# Patient Record
Sex: Male | Born: 1945 | Race: White | Hispanic: No | Marital: Married | Smoking: Former smoker
Health system: Southern US, Community
[De-identification: ages and names within clinical notes are randomized; demographics above are authoritative.]

## PROBLEM LIST (undated history)

## (undated) DIAGNOSIS — I10 Essential (primary) hypertension: Secondary | ICD-10-CM

## (undated) DIAGNOSIS — J309 Allergic rhinitis, unspecified: Secondary | ICD-10-CM

## (undated) DIAGNOSIS — E785 Hyperlipidemia, unspecified: Secondary | ICD-10-CM

## (undated) DIAGNOSIS — J45909 Unspecified asthma, uncomplicated: Secondary | ICD-10-CM

## (undated) HISTORY — PX: TONSILLECTOMY: SUR1361

## (undated) HISTORY — DX: Unspecified asthma, uncomplicated: J45.909

## (undated) HISTORY — PX: HEMORROIDECTOMY: SUR656

## (undated) HISTORY — DX: Hyperlipidemia, unspecified: E78.5

## (undated) HISTORY — PX: COLONOSCOPY: SHX174

## (undated) HISTORY — DX: Allergic rhinitis, unspecified: J30.9

## (undated) HISTORY — PX: APPENDECTOMY: SHX54

## (undated) HISTORY — PX: OTHER SURGICAL HISTORY: SHX169

---

## 2010-05-06 DIAGNOSIS — E785 Hyperlipidemia, unspecified: Secondary | ICD-10-CM | POA: Insufficient documentation

## 2010-05-06 DIAGNOSIS — J309 Allergic rhinitis, unspecified: Secondary | ICD-10-CM | POA: Insufficient documentation

## 2010-05-07 ENCOUNTER — Ambulatory Visit
Admission: RE | Admit: 2010-05-07 | Discharge: 2010-05-07 | Payer: Self-pay | Source: Home / Self Care | Attending: Pulmonary Disease | Admitting: Pulmonary Disease

## 2010-05-07 DIAGNOSIS — J45909 Unspecified asthma, uncomplicated: Secondary | ICD-10-CM | POA: Insufficient documentation

## 2010-05-07 DIAGNOSIS — R05 Cough: Secondary | ICD-10-CM | POA: Insufficient documentation

## 2010-05-27 NOTE — Assessment & Plan Note (Signed)
Summary: consult for chronic cough   Copy to:  Berenda Morale Primary Provider/Referring Provider:  Berenda Morale  CC:  Pulmonary Consult.  History of Present Illness: the pt is a 65y/o male who I have been asked to see for chronic cough.  The pt states that he has been coughing for a year, and started on its on own without URI symptoms.  It is dry and hacking in nature, but the pt denies a tickle in his throat.  He does describe a classic globus sensation.  He tells me that he has had constant throat clearing "all of my life".  He does have postnasal drip, but feels it is a little better with nasal spray and anithistamine.  He denies any reflux symptoms, and tells me his cough was no better after four weeks of prilosec.  He denies any h/o childhood asthma, and states that his exertional tolerance is normal.  He had a cxr 2 weeks ago that was normal.    Current Medications (verified): 1)  Lipitor 10 Mg Tabs (Atorvastatin Calcium) .... Once Daily 2)  Fluticasone Propionate 50 Mcg/act Susp (Fluticasone Propionate) .... 2 Sprays At Bedtime 3)  Adult Aspirin Ec Low Strength 81 Mg Tbec (Aspirin) .... Once Daily 4)  Allegra Allergy 180 Mg Tabs (Fexofenadine Hcl) .... Once Daily  Allergies (verified): No Known Drug Allergies  Past History:  Past Medical History:  HYPERLIPIDEMIA (ICD-272.4) ALLERGIC RHINITIS (ICD-477.9)    Past Surgical History: hemorrhoidectomy 2004 appendectomy 1990s grown removed from behind L ear 2010  Family History: Reviewed history from 05/06/2010 and no changes required. dementia: father, mother DM: father cancer: daughter (breast)   Social History: Reviewed history from 05/06/2010 and no changes required. Patient former smoker.  started 1964.  1 pack per week.  1982. retired from [employer]. married children 2  Review of Systems       The patient complains of nasal congestion/difficulty breathing through nose.  The patient denies shortness of  breath with activity, shortness of breath at rest, productive cough, non-productive cough, coughing up blood, chest pain, irregular heartbeats, acid heartburn, indigestion, loss of appetite, weight change, abdominal pain, difficulty swallowing, sore throat, tooth/dental problems, headaches, sneezing, itching, ear ache, anxiety, depression, hand/feet swelling, joint stiffness or pain, rash, change in color of mucus, and fever.    Vital Signs:  Patient profile:   65 year old male Height:      72 inches Weight:      207 pounds BMI:     28.18 O2 Sat:      95 % on Room air Temp:     98.0 degrees F oral Pulse rate:   60 / minute BP sitting:   136 / 88  (left arm) Cuff size:   regular  Vitals Entered By: Arman Filter LPN (May 07, 2010 9:45 AM)  O2 Flow:  Room air CC: Pulmonary Consult Comments Medications reviewed with patient Arman Filter LPN  May 07, 2010 9:51 AM    Physical Exam  General:  wd male in nad Eyes:  PERRLA and EOMI.   Nose:  mild septal deviation to left with mild obstruction mild erythema or mucosa Mouth:  clear, no exudates mucus seen posteriorly Neck:  no jvd, tmg, LN Lungs:  totally clear to auscultation Heart:  rrr, no mrg  Abdomen:  soft and nontender, bs+ Extremities:  no edema or cyanosis  pulses intact distally Neurologic:  alert and oriented, moves all 4.   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  He has a very remote h/o smoking in the past.  I would like to start him on ICS that will not further irritate his upper airway, and will minimize cough.    Problem # 2:  COUGH (ICD-786.2) there is no question he has airflow obstruction that can contribute to his cough, but there is other historical info that suggests he has an element of cyclical coughing as well.  He also has known AR with postnasal drip  that can drive his cough.  Will work on behavioral therapies for cyclical coughing, and will also try a stronger antihistamine for his pnd.  Medications Added to Medication List This Visit: 1)  Fluticasone Propionate 50 Mcg/act Susp (Fluticasone propionate) .... 2 sprays at bedtime  Other Orders: Consultation Level V (16109) Spirometry w/Graph (60454)  Patient Instructions: 1)  will start on qvar  2 inhalations am and pm.  Rinse mouth well. 2)  stay on flonase nasal spray 2 each nostril every am 3)  stop allegra.  In its place take chlorpheniramine 8mg  one at bedtime 4)  use hard candy during day to avoid throat clearing 5)  do not overuse your voice (yelling, singing, etc). 6)  followup with me in 3 weeks.

## 2010-05-28 ENCOUNTER — Ambulatory Visit (INDEPENDENT_AMBULATORY_CARE_PROVIDER_SITE_OTHER): Payer: Medicare Other | Admitting: Pulmonary Disease

## 2010-05-28 ENCOUNTER — Encounter: Payer: Self-pay | Admitting: Pulmonary Disease

## 2010-05-28 DIAGNOSIS — R05 Cough: Secondary | ICD-10-CM

## 2010-05-28 DIAGNOSIS — J45991 Cough variant asthma: Secondary | ICD-10-CM

## 2010-05-28 DIAGNOSIS — R059 Cough, unspecified: Secondary | ICD-10-CM

## 2010-06-17 NOTE — Assessment & Plan Note (Signed)
Summary: rov for chronic cough   Copy to:  Berenda Morale Primary Provider/Referring Provider:  Berenda Morale  CC:  Pt here for follow-up for chronic cough.  Pt states cough is 75% improved. Marland Kitchen  History of Present Illness: the pt comes in today for f/u of his multifactorial cough.  He was felt to have an upper airway cough, but also found to have airflow obstruction by spirometry.  He has been started on qvar, but also treated for upper dysfunction syndrome.  He states that his cough is at least 75% improved.  Current Medications (verified): 1)  Lipitor 10 Mg Tabs (Atorvastatin Calcium) .... Once Daily 2)  Fluticasone Propionate 50 Mcg/act Susp (Fluticasone Propionate) .... 2 Sprays At Bedtime 3)  Adult Aspirin Ec Low Strength 81 Mg Tbec (Aspirin) .... Once Daily 4)  Chlorpheniramine Maleate 4 Mg Tabs (Chlorpheniramine Maleate) .... 2  Tablet At Betime 5)  Qvar 80 Mcg/act Aers (Beclomethasone Dipropionate) .... 2 Puffs Twice Daily  Allergies (verified): No Known Drug Allergies  Review of Systems  The patient denies shortness of breath with activity, shortness of breath at rest, productive cough, non-productive cough, coughing up blood, chest pain, irregular heartbeats, acid heartburn, indigestion, loss of appetite, weight change, abdominal pain, difficulty swallowing, sore throat, tooth/dental problems, headaches, nasal congestion/difficulty breathing through nose, sneezing, itching, ear ache, anxiety, depression, hand/feet swelling, joint stiffness or pain, rash, change in color of mucus, and fever.    Vital Signs:  Patient profile:   65 year old male Height:      72 inches Weight:      199.13 pounds O2 Sat:      95 % on Room air Temp:     98.5 degrees F oral Pulse rate:   64 / minute BP sitting:   122 / 78  (right arm) Cuff size:   regular  Vitals Entered By: Carron Curie CMA (May 28, 2010 10:33 AM)  O2 Flow:  Room air CC: Pt here for follow-up for chronic cough.  Pt  states cough is 75% improved.  Comments Medications reviewed with patient Carron Curie CMA  May 28, 2010 10:37 AM Daytime phone number verified with patient.    Physical Exam  General:  wd male in nad Lungs:  totally clear to auscultation Heart:  rrr, no mrg Extremities:  no edema or cyanosis  Neurologic:  alert, oriented, moves all 4.    Impression & Recommendations:  Problem # 1:  COUGH (ICD-786.2) the pt is 75% improved with treatment of upper airway irritation and cyclical cough.  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  Will continue on qvar for another 3 mos, then try to d/c.  If cough comes back, will need to stay on ICS for lifetime.  Medications Added to Medication List This Visit: 1)  Chlorpheniramine Maleate 4 Mg Tabs (Chlorpheniramine maleate) .... 2  tablet at betime 2)  Qvar 80 Mcg/act Aers (Beclomethasone dipropionate) .... 2 puffs twice daily  Other Orders: Est. Patient Level III (43329)  Patient Instructions: 1)  can change nasal spray and chlorpheniramine to as needed. 2)  stay on qvar until next visit 3)  continue behavioral therapies as we have discussed if you think the cough begins to  re-escalate. 4)  followup with me in 3mos.   Prescriptions: QVAR 80 MCG/ACT AERS (BECLOMETHASONE DIPROPIONATE) 2 puffs twice daily  #1 x 6   Entered and Authorized by:   Barbaraann Share MD   Signed by:   Barbaraann Share MD on 05/28/2010   Method used:   Print then Give to Patient   RxID:   [id number]    Immunization History:  Influenza Immunization History:    Influenza:  historical (02/09/2010)  Pneumovax Immunization History:    Pneumovax:  historical (01/12/2009)

## 2010-08-27 ENCOUNTER — Ambulatory Visit: Payer: Medicare Other | Admitting: Pulmonary Disease

## 2010-08-31 ENCOUNTER — Encounter: Payer: Self-pay | Admitting: Pulmonary Disease

## 2010-09-08 ENCOUNTER — Encounter: Payer: Self-pay | Admitting: Pulmonary Disease

## 2010-09-08 ENCOUNTER — Ambulatory Visit (INDEPENDENT_AMBULATORY_CARE_PROVIDER_SITE_OTHER): Payer: Medicare Other | Admitting: Pulmonary Disease

## 2010-09-08 DIAGNOSIS — J45909 Unspecified asthma, uncomplicated: Secondary | ICD-10-CM

## 2010-09-08 DIAGNOSIS — R059 Cough, unspecified: Secondary | ICD-10-CM

## 2010-09-08 DIAGNOSIS — R05 Cough: Secondary | ICD-10-CM

## 2010-09-08 NOTE — Progress Notes (Signed)
  Subjective:    Patient ID: Lucas Murphy, male    DOB: 03-14-1946, 65 y.o.   MRN: [id number]  HPI The pt comes in today for f/u of his cough and possible asthma.  He was started on qvar for cough and airflow obstruction noted on spirometry, but it was unclear if this was a postviral bronchiolitis vs true cough variant asthma.  He comes in today where he has been compliant with qvar, and feels his cough and breathing is well controlled.  He does have postnasal drip at times with mucus in his throat.    Review of Systems  Constitutional: Negative for fever and unexpected weight change.  HENT: Positive for rhinorrhea and postnasal drip. Negative for ear pain, nosebleeds, congestion, sore throat, sneezing, trouble swallowing, dental problem and sinus pressure.   Eyes: Negative for redness and itching.  Respiratory: Positive for cough. Negative for chest tightness, shortness of breath and wheezing.   Cardiovascular: Negative for palpitations and leg swelling.  Gastrointestinal: Negative for nausea and vomiting.  Genitourinary: Negative for dysuria.  Musculoskeletal: Negative for joint swelling.  Skin: Positive for rash.  Neurological: Negative for headaches.  Hematological: Does not bruise/bleed easily.  Psychiatric/Behavioral: Negative for dysphoric mood. The patient is not nervous/anxious.        Objective:   Physical Exam Wd male in nad Chest totally clear Cor with rrr LE without edema, no cyanosis noted. Alert, oriented, moves all 4        Assessment & Plan:

## 2010-09-08 NOTE — Patient Instructions (Signed)
Trial off qvar to see if your cough worsens or if your breathing changes.  If you need to get back on qvar, please make an apptm with me in 6mos.  If you see no change off qvar, followup with me as needed. Can use nasal spray and antihistamine as needed for postnasal drip

## 2010-09-11 NOTE — Assessment & Plan Note (Signed)
It is unclear whether his past airflow obstruction on spirometry was related to a postviral bronchiolitis at the time, or whether is part of asthma.  He has done well over the months since the prior visit, and it may be worthwhile to try him off qvar to see if he has a return of his symptoms.  If he does, then clearly this is asthma and he will require maintenance ICS.  If his symptoms do not return, then his episode earlier in the year was probably a post viral issue.

## 2010-09-11 NOTE — Assessment & Plan Note (Addendum)
The pt's cough has nearly resolved on treatment.  I continue to believe his cough is MORE upper airway in origin than lower and related to a post-viral cough, however cannot determine at this point if it may be related to asthma as well.   He is continuing with behavioral therapies to minimize throat clearing and the cyclical component of the cough, and is using nasal ICS and an antihistamine for his postnasal drip.

## 2011-03-11 ENCOUNTER — Ambulatory Visit: Payer: Medicare Other | Admitting: Pulmonary Disease

## 2011-03-14 ENCOUNTER — Ambulatory Visit (INDEPENDENT_AMBULATORY_CARE_PROVIDER_SITE_OTHER): Payer: Medicare Other | Admitting: Adult Health

## 2011-03-14 ENCOUNTER — Encounter: Payer: Self-pay | Admitting: Adult Health

## 2011-03-14 VITALS — BP 128/82 | HR 50 | Temp 97.9°F | Ht 72.0 in | Wt 189.0 lb

## 2011-03-14 DIAGNOSIS — J45909 Unspecified asthma, uncomplicated: Secondary | ICD-10-CM

## 2011-03-14 NOTE — Patient Instructions (Signed)
Begin Dulera 2 puffs Twice daily   Stop QVAR  May take Chlor tabs 4 mg 1 every 4 hr As needed  Drainge/tickle in throat  Continue on Chlor tabs 4mg  2 At bedtime   Nexium 40mg   daily before meals for 2 weeks  Try to avoid clearing throat/coughing is possilbe, use sugarless candy, water, etc NO mints.  follow up Dr. Shelle Iron in 3-4 weeks and As needed   Please contact office for sooner follow up if symptoms do not improve or worsen or seek emergency care

## 2011-03-17 ENCOUNTER — Encounter: Payer: Self-pay | Admitting: Adult Health

## 2011-03-17 NOTE — Assessment & Plan Note (Signed)
Mild flare with associated chronic cough , ? coomplicated by Rhinitis and gerd   Plan:  Begin Dulera 2 puffs Twice daily   Stop QVAR  May take Chlor tabs 4 mg 1 every 4 hr As needed  Drainge/tickle in throat  Continue on Chlor tabs 4mg  2 At bedtime   Nexium 40mg   daily before meals for 2 weeks  Try to avoid clearing throat/coughing is possilbe, use sugarless candy, water, etc NO mints.  follow up Dr. Shelle Iron in 3-4 weeks and As needed   Please contact office for sooner follow up if symptoms do not improve or worsen or seek emergency

## 2011-03-17 NOTE — Progress Notes (Signed)
  Subjective:    Patient ID: Lucas Murphy, male    DOB: [birth year], 65 y.o.   MRN: [id number]  HPI 65 yo male followed for cough and possible asthma   03/14/11 Acute OV  Complains of runny nose, post nasal drip, dry cough and dyspnea.  Going on for while now -~ 2 months. More wheezing over last week.  Has a lot of drainage.  OTC not helping.  Worse at night    Review of Systems Constitutional:   No  weight loss, night sweats,  Fevers, chills,  +fatigue, or  lassitude.  HEENT:   No headaches,  Difficulty swallowing,  Tooth/dental problems, or  Sore throat,                No sneezing, itching, ear ache, nasal congestion,  ++post nasal drip,   CV:  No chest pain,  Orthopnea, PND, swelling in lower extremities, anasarca, dizziness, palpitations, syncope.   GI  No heartburn, indigestion, abdominal pain, nausea, vomiting, diarrhea, change in bowel habits, loss of appetite, bloody stools.   Resp:  ,  No coughing up of blood.     No chest wall deformity  Skin: no rash or lesions.  GU: no dysuria, change in color of urine, no urgency or frequency.  No flank pain, no hematuria   MS:  No joint pain or swelling.  No decreased range of motion.  No back pain.  Psych:  No change in mood or affect. No depression or anxiety.  No memory loss.          Objective:   Physical Exam GEN: A/Ox3; pleasant , NAD,   HEENT:  [place]/AT,  EACs-clear, TMs-wnl, NOSE-clear, THROAT-clear, no lesions, no postnasal drip or exudate noted.   NECK:  Supple w/ fair ROM; no JVD; normal carotid impulses w/o bruits; no thyromegaly or nodules palpated; no lymphadenopathy.  RESP  Coarse BS  w/o, wheezes/ rales/ or rhonchi.no accessory muscle use, no dullness to percussion  CARD:  RRR, no m/r/g  , no peripheral edema, pulses intact, no cyanosis or clubbing.  GI:   Soft & nt; nml bowel sounds; no organomegaly or masses detected.  Musco: Warm bil, no deformities or joint swelling noted.   Neuro: alert, no focal  deficits noted.    Skin: Warm, no lesions or rashes         Assessment & Plan:

## 2011-04-13 ENCOUNTER — Encounter: Payer: Self-pay | Admitting: Pulmonary Disease

## 2011-04-13 ENCOUNTER — Ambulatory Visit (INDEPENDENT_AMBULATORY_CARE_PROVIDER_SITE_OTHER): Payer: Medicare Other | Admitting: Pulmonary Disease

## 2011-04-13 ENCOUNTER — Telehealth: Payer: Self-pay | Admitting: Pulmonary Disease

## 2011-04-13 DIAGNOSIS — J309 Allergic rhinitis, unspecified: Secondary | ICD-10-CM | POA: Diagnosis not present

## 2011-04-13 DIAGNOSIS — J45909 Unspecified asthma, uncomplicated: Secondary | ICD-10-CM

## 2011-04-13 MED ORDER — MOMETASONE FURO-FORMOTEROL FUM 100-5 MCG/ACT IN AERO: 2.0000 | INHALATION_SPRAY | Freq: Two times a day (BID) | RESPIRATORY_TRACT | Status: DC

## 2011-04-13 NOTE — Progress Notes (Signed)
  Subjective:    Patient ID: Lucas Murphy, male    DOB: 1945-12-02, 66 y.o.   MRN: [id number]  HPI The patient comes in today for followup of his probable asthma.  It was initially unclear if he had a post viral bronchiolitis or whether he truly had underlying asthma.  He did have documented airflow obstruction after a viral URI.  The patient was also felt to have some upper airway dysfunction based on his symptomatology, probably related to postnasal drip.  He was recently seen by a nurse practitioner for increasing dyspnea on exertion, as well as some upper airway symptoms.  The patient had stopped Qvar, but noticed fairly quickly that his exertional tolerance and symptoms worsened, and therefore he restarted.  After being back on Qvar, he did not feel that he was responding as well as he had in the past.  Therefore, our nurse practitioner started him on dulera, and he feels that he is doing much better with this medication.  She also asked him to please and antihistamine more consistently, and gave him 2 weeks of Nexium for possible LPR.  The patient comes in today where he is doing very well since being on the dulera, but continues to have some postnasal drip symptoms.   Review of Systems  Constitutional: Negative for fever and unexpected weight change.  HENT: Positive for rhinorrhea. Negative for ear pain, nosebleeds, congestion, sore throat, sneezing, trouble swallowing, dental problem, postnasal drip and sinus pressure.   Eyes: Negative for redness and itching.  Respiratory: Negative for cough, chest tightness, shortness of breath and wheezing.   Cardiovascular: Negative for palpitations and leg swelling.  Gastrointestinal: Negative for nausea and vomiting.  Genitourinary: Negative for dysuria.  Musculoskeletal: Negative for joint swelling.  Skin: Negative for rash.  Neurological: Negative for headaches.  Hematological: Does not bruise/bleed easily.  Psychiatric/Behavioral: Negative for  dysphoric mood. The patient is not nervous/anxious.        Objective:   Physical Exam Well-developed male in no acute distress Nose without purulence or discharge noted Chest is totally clear to auscultation, no wheezes Heart exam with regular rate and rhythm Lower extremities without edema or cyanosis Alert and oriented, moves all 4 extremities.       Assessment & Plan:

## 2011-04-13 NOTE — Patient Instructions (Addendum)
I think you do have asthma, and will need to stay on some type of maintenance inhaler.  Either qvar or dulera would be good choices, and you can experiment with which helps you the most.  Let us know. Can take chlorpheniramine or zyrtec over the counter for your allergy symptoms.  Your nasal spray will help as well. followup with me in one year if doing well, but call if having issues.

## 2011-04-13 NOTE — Telephone Encounter (Signed)
I spoke with pt and he states the dulera was to expensive and wants an alternative to this. Pt also states that he would like a written rx for QVAR and would like to pick this up when ready. Pt states he though he had an rx already for QVAR but did not. Please advise Dr. Shelle Iron, thanks

## 2011-04-13 NOTE — Assessment & Plan Note (Signed)
The patient's history is most consistent with asthma, and therefore I do think he needs to be on some type of inhaler that has inhaled corticosteroid for maintenance.  The patient has done very well with dulera, but is wanting to try Qvar again to see if he can get by with less medication.  He will then decide which medication he wishes to stay on long-term for maintenance.

## 2011-04-13 NOTE — Telephone Encounter (Signed)
Ok to send rx for qvar

## 2011-04-13 NOTE — Assessment & Plan Note (Signed)
The pt has AR that is contributing to his intermittant ua instability.  I doubt reflux is an issue for him.  I asked him to continue on his nasal steroid, as well as prn antihistamine.

## 2011-04-14 MED ORDER — BECLOMETHASONE DIPROPIONATE 80 MCG/ACT IN AERS: 2.0000 | INHALATION_SPRAY | Freq: Two times a day (BID) | RESPIRATORY_TRACT | Status: DC

## 2011-04-14 NOTE — Telephone Encounter (Signed)
RX Qvar sent to [facility] on [address] LMOMTCB x 1 so pt can be notified.

## 2011-04-14 NOTE — Telephone Encounter (Signed)
Called and spoke with pt's Diplomatic Services operational officer.  Informed her KC out of office today but will be back tomorrow to sign rx.  Printed rx and put in [facility] VIP folder for him to sign.  ( I also called [facility] [address] To cancel the original rx that Lawson Fiscal had sent electronically)

## 2011-04-14 NOTE — Telephone Encounter (Signed)
Pt DOES NOT want this Qvar Rx called into [facility] [address].  Pt would like the written Rx.  Please call pt at [phone].  Antionette Fairy

## 2011-04-15 NOTE — Telephone Encounter (Signed)
Done

## 2011-04-15 NOTE — Telephone Encounter (Signed)
Called and spoke with pt's secretary who is aware rx signed and ready at front desk for pt to pick up M-F 8 to 5:30 pm.

## 2011-06-29 DIAGNOSIS — M79609 Pain in unspecified limb: Secondary | ICD-10-CM | POA: Diagnosis not present

## 2011-06-29 DIAGNOSIS — Z79899 Other long term (current) drug therapy: Secondary | ICD-10-CM | POA: Diagnosis not present

## 2011-06-29 DIAGNOSIS — R5381 Other malaise: Secondary | ICD-10-CM | POA: Diagnosis not present

## 2011-11-18 ENCOUNTER — Other Ambulatory Visit: Payer: Self-pay | Admitting: Pulmonary Disease

## 2011-12-28 DIAGNOSIS — E7800 Pure hypercholesterolemia, unspecified: Secondary | ICD-10-CM | POA: Diagnosis not present

## 2011-12-28 DIAGNOSIS — J45909 Unspecified asthma, uncomplicated: Secondary | ICD-10-CM | POA: Diagnosis not present

## 2011-12-28 DIAGNOSIS — Z23 Encounter for immunization: Secondary | ICD-10-CM | POA: Diagnosis not present

## 2011-12-28 DIAGNOSIS — Z79899 Other long term (current) drug therapy: Secondary | ICD-10-CM | POA: Diagnosis not present

## 2011-12-28 DIAGNOSIS — R21 Rash and other nonspecific skin eruption: Secondary | ICD-10-CM | POA: Diagnosis not present

## 2011-12-28 DIAGNOSIS — J309 Allergic rhinitis, unspecified: Secondary | ICD-10-CM | POA: Diagnosis not present

## 2012-03-22 DIAGNOSIS — L03019 Cellulitis of unspecified finger: Secondary | ICD-10-CM | POA: Diagnosis not present

## 2012-04-18 ENCOUNTER — Telehealth: Payer: Self-pay | Admitting: Pulmonary Disease

## 2012-04-18 ENCOUNTER — Ambulatory Visit (INDEPENDENT_AMBULATORY_CARE_PROVIDER_SITE_OTHER): Payer: Medicare Other | Admitting: Pulmonary Disease

## 2012-04-18 ENCOUNTER — Encounter: Payer: Self-pay | Admitting: Pulmonary Disease

## 2012-04-18 VITALS — BP 162/92 | HR 54 | Temp 97.6°F | Ht 72.0 in | Wt 182.2 lb

## 2012-04-18 DIAGNOSIS — J45909 Unspecified asthma, uncomplicated: Secondary | ICD-10-CM

## 2012-04-18 DIAGNOSIS — J309 Allergic rhinitis, unspecified: Secondary | ICD-10-CM | POA: Diagnosis not present

## 2012-04-18 NOTE — Telephone Encounter (Signed)
Noted.  Would let primary decide how he wants to handle this.

## 2012-04-18 NOTE — Progress Notes (Signed)
  Subjective:    Patient ID: Lucas Murphy, male    DOB: [birth year], 67 y.o.   MRN: [id number]  HPI Patient comes in today for followup of his asthma.  He has been doing very well on Qvar alone, and rarely uses his albuterol inhaler.  He is looking at various medications that may cost less.  I have written these down for him to check with his insurance company.  He is exercising regularly, and has had no issues with this.  He does still have postnasal drip whenever he plays golf, but otherwise is not a problem for him.   Review of Systems  Constitutional: Negative for fever and unexpected weight change.  HENT: Positive for rhinorrhea. Negative for ear pain, nosebleeds, congestion, sore throat, sneezing, trouble swallowing, dental problem, postnasal drip and sinus pressure.   Eyes: Negative for redness and itching.  Respiratory: Negative for cough, chest tightness, shortness of breath and wheezing.   Cardiovascular: Negative for palpitations and leg swelling.  Gastrointestinal: Negative for nausea and vomiting.  Genitourinary: Negative for dysuria.  Musculoskeletal: Negative for joint swelling.  Skin: Negative for rash.  Neurological: Negative for headaches.  Hematological: Does not bruise/bleed easily.  Psychiatric/Behavioral: Negative for dysphoric mood. The patient is not nervous/anxious.        Objective:   Physical Exam Well-developed male in no acute distress Nose without purulence or discharge noted Chest with totally clear breath sounds, no wheezing Cardiac exam is regular rate and rhythm Lower extremities without edema, no cyanosis Alert and oriented, moves all 4 extremities.        Assessment & Plan:

## 2012-04-18 NOTE — Patient Instructions (Addendum)
Stay on current medications, but will write down alternatives that you can check on for cost comparison. Can try taking zyrtec 10mg  otc the morning of your golf outing to see if helps your nasal symptoms.  Do not take everyday if you are taking chlorpheniramine at bedtime. followup with me in one year if doing well.

## 2012-04-18 NOTE — Assessment & Plan Note (Signed)
The patient is doing well at this point with inhaled corticosteroids alone, and I have asked him to continue on this.  I have also written down various options for him regarding treatment, and he will check with his insurance to see which is the most affordable.  If he is doing well, he will followup in one year, but he is to call if he notices increased albuterol use.

## 2012-04-18 NOTE — Telephone Encounter (Signed)
Pt states that he called his PCP and they gave him his last 2 BP readings: 12/2011 - 120/80 03/22/2012 - 130/80  He asked that I pass this on to [facility].

## 2012-07-04 ENCOUNTER — Telehealth: Payer: Self-pay | Admitting: Pulmonary Disease

## 2012-07-04 MED ORDER — BECLOMETHASONE DIPROPIONATE 80 MCG/ACT IN AERS: INHALATION_SPRAY | RESPIRATORY_TRACT | Status: DC

## 2012-07-04 NOTE — Telephone Encounter (Signed)
Spoke with patient-states he found out that Advair, Symbicort, and Qvar would cost him the same copay and just needed to have [facility] [address] Pt aware that I have sent refills. Nothing more needed at this time.

## 2012-07-09 DIAGNOSIS — Z0000 Encounter for general adult medical examination without abnormal findings: Secondary | ICD-10-CM | POA: Diagnosis not present

## 2012-07-09 DIAGNOSIS — Z136 Encounter for screening for cardiovascular disorders: Secondary | ICD-10-CM | POA: Diagnosis not present

## 2012-07-09 DIAGNOSIS — Z125 Encounter for screening for malignant neoplasm of prostate: Secondary | ICD-10-CM | POA: Diagnosis not present

## 2012-07-09 DIAGNOSIS — B353 Tinea pedis: Secondary | ICD-10-CM | POA: Diagnosis not present

## 2012-07-09 DIAGNOSIS — B351 Tinea unguium: Secondary | ICD-10-CM | POA: Diagnosis not present

## 2012-08-20 DIAGNOSIS — K5730 Diverticulosis of large intestine without perforation or abscess without bleeding: Secondary | ICD-10-CM | POA: Diagnosis not present

## 2012-08-20 DIAGNOSIS — Z1211 Encounter for screening for malignant neoplasm of colon: Secondary | ICD-10-CM | POA: Diagnosis not present

## 2012-08-20 DIAGNOSIS — D126 Benign neoplasm of colon, unspecified: Secondary | ICD-10-CM | POA: Diagnosis not present

## 2012-09-12 DIAGNOSIS — L820 Inflamed seborrheic keratosis: Secondary | ICD-10-CM | POA: Diagnosis not present

## 2012-09-12 DIAGNOSIS — D235 Other benign neoplasm of skin of trunk: Secondary | ICD-10-CM | POA: Diagnosis not present

## 2012-11-05 ENCOUNTER — Telehealth: Payer: Self-pay | Admitting: Pulmonary Disease

## 2012-11-05 NOTE — Telephone Encounter (Signed)
This is very difficult to dx/manage over the phone, suggest TParret OV this week

## 2012-11-05 NOTE — Telephone Encounter (Signed)
Spoke with pt in regards to coughing episodes. Pt states that he is having coughing "spells"/choking "spells" with liquids sporadically---not a constant thing, but he has never had this issue before. Pt asking if something that he is drinking could be causing this---states that he has really bad spells at times while drinking his coffee. Pt also notes increased cough/choking spells with warmer weather and increased humidity.  Pt would like to know if this is being caused by something that he is doing or what could possibly be causing this issue.  Last seen by [facility] 04/18/2012---told to follow up in 1 year.  Pt aware of KC being out of office.  Dr Delford Field, please advise any recs for pt. Thanks.

## 2012-11-05 NOTE — Telephone Encounter (Signed)
I spoke with pt. He is scheduled to see TP tomorrow afternoon at 4:30 (pt requests this time). Nothing further was needed

## 2012-11-06 ENCOUNTER — Encounter: Payer: Self-pay | Admitting: Adult Health

## 2012-11-06 ENCOUNTER — Ambulatory Visit (INDEPENDENT_AMBULATORY_CARE_PROVIDER_SITE_OTHER)
Admission: RE | Admit: 2012-11-06 | Discharge: 2012-11-06 | Disposition: A | Discharge status: Home / Self Care | Payer: Medicare Other | Source: Ambulatory Visit | Attending: Adult Health | Admitting: Adult Health

## 2012-11-06 ENCOUNTER — Ambulatory Visit (INDEPENDENT_AMBULATORY_CARE_PROVIDER_SITE_OTHER): Payer: Medicare Other | Admitting: Adult Health

## 2012-11-06 VITALS — BP 132/82 | HR 55 | Temp 98.7°F | Ht 71.5 in | Wt 174.2 lb

## 2012-11-06 DIAGNOSIS — J45909 Unspecified asthma, uncomplicated: Secondary | ICD-10-CM

## 2012-11-06 DIAGNOSIS — R9389 Abnormal findings on diagnostic imaging of other specified body structures: Secondary | ICD-10-CM | POA: Diagnosis not present

## 2012-11-06 DIAGNOSIS — J4520 Mild intermittent asthma, uncomplicated: Secondary | ICD-10-CM

## 2012-11-06 NOTE — Addendum Note (Signed)
Addended by: Boone Master E on: 11/06/2012 05:22 PM   Modules accepted: Orders

## 2012-11-06 NOTE — Assessment & Plan Note (Signed)
Cyclical cough /mild flare  Check xray   Plan  Continue on Clor tabs 4mg  1 in am and 2 At bedtime   Prilosec 20mg  daily before meals for 2 weeks  Try to avoid clearing throat/coughing is possilbe, use sugarless candy, water, etc NO mints.  May use Delsym 2 tsp Twice daily  .As needed  Cough  I will call with chest xray resutls.  follow up Dr. Shelle Iron as planned and As needed   Please contact office for sooner follow up if symptoms do not improve or worsen or seek emergency care

## 2012-11-06 NOTE — Progress Notes (Signed)
  Subjective:    Patient ID: Lucas Murphy, male    DOB: Dec 23, 1945, 67 y.o.   MRN: [id number]  HPI  67 yo male followed for cough and asthma   11/06/2012  Acute OV  Complains of dry cough, throat clearing off-and-on   Denies chest congestion, wheezing, tightness, fever.  Has throat clearing, drainage and dry cough that seems to wax and wane.  Does not feel short of breath. Is very active , does not limit his activity . Plays golf, skiing, etc.  Wife has noticed cough may be worse.      Review of Systems  Constitutional:   No  weight loss, night sweats,  Fevers, chills,  +fatigue, or  lassitude.  HEENT:   No headaches,  Difficulty swallowing,  Tooth/dental problems, or  Sore throat,                No sneezing, itching, ear ache, nasal congestion,  ++post nasal drip,   CV:  No chest pain,  Orthopnea, PND, swelling in lower extremities, anasarca, dizziness, palpitations, syncope.   GI  No heartburn, indigestion, abdominal pain, nausea, vomiting, diarrhea, change in bowel habits, loss of appetite, bloody stools.   Resp:  ,  No coughing up of blood.     No chest wall deformity  Skin: no rash or lesions.  GU: no dysuria, change in color of urine, no urgency or frequency.  No flank pain, no hematuria   MS:  No joint pain or swelling.  No decreased range of motion.  No back pain.  Psych:  No change in mood or affect. No depression or anxiety.  No memory loss.          Objective:   Physical Exam  GEN: A/Ox3; pleasant , NAD,   HEENT:  [place]/AT,  EACs-clear, TMs-wnl, NOSE-clear, THROAT-clear, no lesions, no postnasal drip or exudate noted.   NECK:  Supple w/ fair ROM; no JVD; normal carotid impulses w/o bruits; no thyromegaly or nodules palpated; no lymphadenopathy.  RESP  CTA bilaterally no  wheezes/ rales/ or rhonchi.no accessory muscle use, no dullness to percussion  CARD:  RRR, no m/r/g  , no peripheral edema, pulses intact, no cyanosis or clubbing.  GI:   Soft & nt; nml  bowel sounds; no organomegaly or masses detected.  Musco: Warm bil, no deformities or joint swelling noted.   Neuro: alert, no focal deficits noted.    Skin: Warm, no lesions or rashes         Assessment & Plan:

## 2012-11-06 NOTE — Patient Instructions (Addendum)
Continue on Clor tabs 4mg  1 in am and 2 At bedtime   Prilosec 20mg  daily before meals for 2 weeks  Try to avoid clearing throat/coughing is possilbe, use sugarless candy, water, etc NO mints.  May use Delsym 2 tsp Twice daily  .As needed  Cough  I will call with chest xray resutls.  follow up Dr. Shelle Iron as planned and As needed   Please contact office for sooner follow up if symptoms do not improve or worsen or seek emergency care

## 2012-11-08 NOTE — Progress Notes (Signed)
Quick Note:  LMOM TCB x1. ______ 

## 2012-11-08 NOTE — Progress Notes (Signed)
Quick Note:  Patient returned call. Advised of lab results / recs as stated by TP. Pt verbalized understanding and denied any questions. ______ 

## 2012-11-12 ENCOUNTER — Telehealth: Payer: Self-pay | Admitting: Pulmonary Disease

## 2012-11-12 NOTE — Telephone Encounter (Signed)
LMTCB x1 for pt.  

## 2012-11-12 NOTE — Telephone Encounter (Signed)
i spoke with pt. He stated he started the prilosec as advised by TP but forgot to ask what it was for. I advised him of her last note. Nothing further was needed

## 2012-12-26 ENCOUNTER — Telehealth: Payer: Self-pay | Admitting: Pulmonary Disease

## 2012-12-26 MED ORDER — BECLOMETHASONE DIPROPIONATE 80 MCG/ACT IN AERS: INHALATION_SPRAY | RESPIRATORY_TRACT | Status: DC

## 2012-12-26 NOTE — Telephone Encounter (Signed)
Called, spoke with pt.  Qvar rx was sent to [facility].  Pt aware.

## 2013-01-21 DIAGNOSIS — Z23 Encounter for immunization: Secondary | ICD-10-CM | POA: Diagnosis not present

## 2013-04-19 ENCOUNTER — Ambulatory Visit (INDEPENDENT_AMBULATORY_CARE_PROVIDER_SITE_OTHER): Payer: Medicare Other | Admitting: Pulmonary Disease

## 2013-04-19 ENCOUNTER — Encounter: Payer: Self-pay | Admitting: Pulmonary Disease

## 2013-04-19 ENCOUNTER — Encounter (INDEPENDENT_AMBULATORY_CARE_PROVIDER_SITE_OTHER): Payer: Self-pay

## 2013-04-19 VITALS — BP 122/90 | HR 63 | Temp 97.4°F | Ht 72.0 in | Wt 174.4 lb

## 2013-04-19 DIAGNOSIS — J45909 Unspecified asthma, uncomplicated: Secondary | ICD-10-CM

## 2013-04-19 DIAGNOSIS — J309 Allergic rhinitis, unspecified: Secondary | ICD-10-CM

## 2013-04-19 MED ORDER — ALBUTEROL SULFATE HFA 108 (90 BASE) MCG/ACT IN AERS: 2.0000 | INHALATION_SPRAY | Freq: Four times a day (QID) | RESPIRATORY_TRACT | Status: DC | PRN

## 2013-04-19 NOTE — Progress Notes (Signed)
   Subjective:    Patient ID: Lucas Murphy, male    DOB: [birth year], 68 y.o.   MRN: [id number]  HPI Patient comes in today for followup of his known asthma. He has done very well on Qvar, and has not had an acute exacerbation since last visit. He enjoys a very active lifestyle, and is not limited by his breathing.   Review of Systems  Constitutional: Negative for fever and unexpected weight change.  HENT: Negative for congestion, dental problem, ear pain, nosebleeds, postnasal drip, rhinorrhea, sinus pressure, sneezing, sore throat and trouble swallowing.   Eyes: Negative for redness and itching.  Respiratory: Negative for cough, chest tightness, shortness of breath and wheezing.   Cardiovascular: Negative for palpitations and leg swelling.  Gastrointestinal: Negative for nausea and vomiting.  Genitourinary: Negative for dysuria.  Musculoskeletal: Negative for joint swelling.  Skin: Negative for rash.  Neurological: Negative for headaches.  Hematological: Does not bruise/bleed easily.  Psychiatric/Behavioral: Negative for dysphoric mood. The patient is not nervous/anxious.        Objective:   Physical Exam Well-developed male in no acute distress Nose without purulence or discharge noted Neck without lymphadenopathy or thyromegaly Chest totally clear to auscultation Cardiac exam with regular rate and rhythm Lower extremities without edema, no cyanosis Alert and oriented, moves all 4 extremities       Assessment & Plan:

## 2013-04-19 NOTE — Patient Instructions (Signed)
Continue on qvar. Will send in prescription for albuterol to have on hand in the event of breathing issues.  Can take 2 puffs every 6 hrs if needed for "bad days". followup with me again in one year, but call if having breathing issues.

## 2013-04-19 NOTE — Assessment & Plan Note (Signed)
The patient is doing well from a pulmonary standpoint on Qvar. He's had no acute exacerbations or pulmonary infection since the last visit. He did have an episode of increased cough this summer that was probably secondary to postnasal drip and maybe reflux. This has subsequently resolved. I've asked him to continue on his maintenance inhaler, and to come back and see me in one year.

## 2013-07-15 DIAGNOSIS — Z125 Encounter for screening for malignant neoplasm of prostate: Secondary | ICD-10-CM | POA: Diagnosis not present

## 2013-07-15 DIAGNOSIS — R030 Elevated blood-pressure reading, without diagnosis of hypertension: Secondary | ICD-10-CM | POA: Diagnosis not present

## 2013-07-15 DIAGNOSIS — Z1331 Encounter for screening for depression: Secondary | ICD-10-CM | POA: Diagnosis not present

## 2013-07-15 DIAGNOSIS — Z23 Encounter for immunization: Secondary | ICD-10-CM | POA: Diagnosis not present

## 2013-07-15 DIAGNOSIS — R413 Other amnesia: Secondary | ICD-10-CM | POA: Diagnosis not present

## 2013-07-15 DIAGNOSIS — E7800 Pure hypercholesterolemia, unspecified: Secondary | ICD-10-CM | POA: Diagnosis not present

## 2013-07-15 DIAGNOSIS — Z79899 Other long term (current) drug therapy: Secondary | ICD-10-CM | POA: Diagnosis not present

## 2013-07-15 DIAGNOSIS — Z0000 Encounter for general adult medical examination without abnormal findings: Secondary | ICD-10-CM | POA: Diagnosis not present

## 2013-07-26 ENCOUNTER — Encounter (INDEPENDENT_AMBULATORY_CARE_PROVIDER_SITE_OTHER): Payer: Self-pay

## 2013-07-26 ENCOUNTER — Ambulatory Visit (INDEPENDENT_AMBULATORY_CARE_PROVIDER_SITE_OTHER): Payer: Medicare Other | Admitting: Diagnostic Neuroimaging

## 2013-07-26 ENCOUNTER — Encounter: Payer: Self-pay | Admitting: Diagnostic Neuroimaging

## 2013-07-26 VITALS — BP 159/94 | HR 59 | Ht 71.5 in | Wt 166.5 lb

## 2013-07-26 DIAGNOSIS — I10 Essential (primary) hypertension: Secondary | ICD-10-CM | POA: Diagnosis not present

## 2013-07-26 DIAGNOSIS — R413 Other amnesia: Secondary | ICD-10-CM

## 2013-07-26 NOTE — Patient Instructions (Signed)
I will check additional testing.  Focus on physical activity, diet (veggies, fruits, nuts, berries), mental and social stimulation.

## 2013-07-26 NOTE — Progress Notes (Signed)
[facility]  PATIENT: Lucas Murphy DOB: July 17, 1945  REFERRING CLINICIAN: Mady Haagensen HISTORY FROM: patient  REASON FOR VISIT: new consult   HISTORICAL  CHIEF COMPLAINT:  Chief Complaint  Patient presents with  . Memory Loss    HISTORY OF PRESENT ILLNESS:   68 year old right-handed male here for evaluation of memory loss and cognitive difficulty.  For past 2 years patient has had intermittent episodes of forgetting recent events, having to take more notes, word hesitancy and word finding difficulty. This is noticed by himself and his wife. Patient retired in 2011 and moved to [place]. Since that time he's been very active physically and socially. However he is concerned about his gradually progressive cognitive issues. Patient's mother and father both had dementia.  No change in smell or taste. No vivid dreams. No punching or kicking in sleep. No balance or walking difficulty. No syncope, chest pain or shortness of breath.  Otherwise patient is able to maintain all of his activities of daily living. 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  REVIEW OF SYSTEMS: Full 14 system review of systems performed and notable only for mild snoring mild memory loss.  ALLERGIES: No Known Allergies  HOME MEDICATIONS: Outpatient Prescriptions Prior to Visit  Medication Sig Dispense Refill  . aspirin 81 MG tablet Take 81 mg by mouth daily.        . beclomethasone (QVAR) 80 MCG/ACT inhaler INHALE 2 PUFFS INTO THE LUNG 2 TIMES DAILY  3 Inhaler  1  . chlorpheniramine (CHLOR-TRIMETON) 4 MG tablet 2 tablets at bedtime.  And also takes 1 tab daily .      . fluticasone (FLONASE) 50 MCG/ACT nasal spray Place 2 sprays into the nose daily.       Marland Kitchen albuterol (PROVENTIL HFA;VENTOLIN HFA) 108 (90 BASE) MCG/ACT inhaler Inhale 2 puffs into the lungs every 6 (six) hours as needed for wheezing or shortness of breath.  3 Inhaler   0   No facility-administered medications prior to visit.    PAST MEDICAL HISTORY: Past Medical History  Diagnosis Date  . Other and unspecified hyperlipidemia   . Allergic rhinitis, cause unspecified     PAST SURGICAL HISTORY: Past Surgical History  Procedure Laterality Date  . Appendectomy    . Hemorroidectomy      FAMILY HISTORY: Family History  Problem Relation Age of Onset  . Diabetes Father   . Dementia Father   . Dementia    . Dementia Mother     SOCIAL HISTORY:  History   Social History  . Marital Status: Married    Spouse Name: [name]    Number of Children: 2  . Years of Education: College   Occupational History  . Retired   .     Social History Main Topics  . Smoking status: Former Smoker -- 0.10 packs/day for 8 years    Types: Cigarettes    Quit date: 04/11/1980  . Smokeless tobacco: Never Used  . Alcohol Use: Yes     Comment: 2-3 weekly  . Drug Use: No  . Sexual Activity: Not on file   Other Topics Concern  . Not on file   Social History Narrative   Patient lives at home with wife.   Caffeine use: 2-3 cups daily     PHYSICAL EXAM  Filed Vitals:   07/26/13 0927  BP: 159/94  Pulse: 59  Height: 5' 11.5" (1.816 m)  Weight: 166 lb  8 oz (75.524 kg)    Not recorded    Body mass index is 22.9 kg/(m^2).  GENERAL EXAM: Patient is in no distress; well developed, nourished and groomed; neck is supple  CARDIOVASCULAR: Regular rate and rhythm, no murmurs, no carotid bruits  NEUROLOGIC: MENTAL STATUS: awake, alert, oriented to person, place and time, recent and remote memory intact, normal attention and concentration, language fluent, comprehension intact, naming intact, fund of knowledge appropriate; MMSE 28/30 (MISSES 1 ON RECALL AND PENTAGONS). AFT 11. GDS 1. NO FRONTAL RELEASE SIGNS. CRANIAL NERVE: no papilledema on fundoscopic exam, pupils equal and reactive to light, visual fields full to confrontation, extraocular muscles intact, no  nystagmus, facial sensation and strength symmetric, hearing intact, palate elevates symmetrically, uvula midline, shoulder shrug symmetric, tongue midline. MOTOR: normal bulk and tone, full strength in the BUE, BLE SENSORY: normal and symmetric to light touch, pinprick, temperature, vibration COORDINATION: finger-nose-finger, fine finger movements normal REFLEXES: deep tendon reflexes present and symmetric GAIT/STATION: narrow based gait; able to walk on toes, heels and tandem; romberg is negative    DIAGNOSTIC DATA (LABS, IMAGING, TESTING) - I reviewed patient records, labs, notes, testing and imaging myself where available.  No results found for this basename: WBC, HGB, HCT, MCV, PLT   No results found for this basename: na, k, cl, co2, glucose, bun, creatinine, calcium, prot, albumin, ast, alt, alkphos, bilitot, gfrnonaa, gfraa   No results found for this basename: CHOL, HDL, LDLCALC, LDLDIRECT, TRIG, CHOLHDL   No results found for this basename: HGBA1C   No results found for this basename: VITAMINB12   No results found for this basename: TSH      ASSESSMENT AND PLAN  68 y.o. year old male here with subjective memory loss word finding difficulties over past one to 2 years. Neurologic examination is unremarkable (MMSE 28/30).  Ddx: normal aging vs MCI vs early neurodegenerative dz  PLAN: - MRI, B12 level - Neurotrax computer cognitive assessment - reviewed importance of physical activity, diet (veggies, fruits, nuts, berries), mental and social stimulation   Orders Placed This Encounter  Procedures  . MR Brain Wo Contrast  . Vitamin B12   Return in about 3 months (around 10/25/2013).    Penni Bombard, MD 05/21/4707, 62:83 AM Certified in Neurology, Neurophysiology and Neuroimaging  [facility] [address] [place], [place] [address] [phone]

## 2013-07-27 LAB — VITAMIN B12: Vitamin B-12: 738 pg/mL (ref 211–946)

## 2013-08-02 ENCOUNTER — Ambulatory Visit (INDEPENDENT_AMBULATORY_CARE_PROVIDER_SITE_OTHER): Payer: Medicare Other | Admitting: Diagnostic Neuroimaging

## 2013-08-02 DIAGNOSIS — R413 Other amnesia: Secondary | ICD-10-CM | POA: Diagnosis not present

## 2013-08-08 DIAGNOSIS — I10 Essential (primary) hypertension: Secondary | ICD-10-CM | POA: Diagnosis not present

## 2013-08-21 ENCOUNTER — Ambulatory Visit
Admission: RE | Admit: 2013-08-21 | Discharge: 2013-08-21 | Disposition: A | Discharge status: Home / Self Care | Payer: Medicare Other | Source: Ambulatory Visit | Attending: Diagnostic Neuroimaging | Admitting: Diagnostic Neuroimaging

## 2013-08-21 DIAGNOSIS — R413 Other amnesia: Secondary | ICD-10-CM

## 2013-09-09 ENCOUNTER — Telehealth: Payer: Self-pay | Admitting: Diagnostic Neuroimaging

## 2013-09-09 NOTE — Telephone Encounter (Signed)
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   Mild scattered periventricular and subcortical foci of T2 hyperintensities; especially near the right peri-atrial region.    B12 was normal.   Keep appt in July 10th, I also advise him to bring his family with him.

## 2013-09-09 NOTE — Telephone Encounter (Signed)
Patient calling and requesting results for MRI and NeuroTrax Testing.  Please call and advise

## 2013-09-10 ENCOUNTER — Encounter (HOSPITAL_COMMUNITY): Payer: Self-pay | Admitting: Emergency Medicine

## 2013-09-10 ENCOUNTER — Emergency Department (HOSPITAL_COMMUNITY)
Admission: EM | Admit: 2013-09-10 | Discharge: 2013-09-10 | Disposition: A | Discharge status: Home / Self Care | Payer: Medicare Other | Attending: Emergency Medicine | Admitting: Emergency Medicine

## 2013-09-10 DIAGNOSIS — Z8709 Personal history of other diseases of the respiratory system: Secondary | ICD-10-CM | POA: Diagnosis not present

## 2013-09-10 DIAGNOSIS — Y929 Unspecified place or not applicable: Secondary | ICD-10-CM | POA: Insufficient documentation

## 2013-09-10 DIAGNOSIS — S61209A Unspecified open wound of unspecified finger without damage to nail, initial encounter: Secondary | ICD-10-CM | POA: Insufficient documentation

## 2013-09-10 DIAGNOSIS — S61219A Laceration without foreign body of unspecified finger without damage to nail, initial encounter: Secondary | ICD-10-CM

## 2013-09-10 DIAGNOSIS — IMO0002 Reserved for concepts with insufficient information to code with codable children: Secondary | ICD-10-CM | POA: Insufficient documentation

## 2013-09-10 DIAGNOSIS — Y9389 Activity, other specified: Secondary | ICD-10-CM | POA: Insufficient documentation

## 2013-09-10 DIAGNOSIS — Z79899 Other long term (current) drug therapy: Secondary | ICD-10-CM | POA: Insufficient documentation

## 2013-09-10 DIAGNOSIS — Z862 Personal history of diseases of the blood and blood-forming organs and certain disorders involving the immune mechanism: Secondary | ICD-10-CM | POA: Insufficient documentation

## 2013-09-10 DIAGNOSIS — Z8639 Personal history of other endocrine, nutritional and metabolic disease: Secondary | ICD-10-CM | POA: Diagnosis not present

## 2013-09-10 DIAGNOSIS — I10 Essential (primary) hypertension: Secondary | ICD-10-CM | POA: Diagnosis not present

## 2013-09-10 DIAGNOSIS — Z7982 Long term (current) use of aspirin: Secondary | ICD-10-CM | POA: Insufficient documentation

## 2013-09-10 DIAGNOSIS — W261XXA Contact with sword or dagger, initial encounter: Secondary | ICD-10-CM

## 2013-09-10 DIAGNOSIS — Z87891 Personal history of nicotine dependence: Secondary | ICD-10-CM | POA: Diagnosis not present

## 2013-09-10 DIAGNOSIS — W260XXA Contact with knife, initial encounter: Secondary | ICD-10-CM | POA: Insufficient documentation

## 2013-09-10 HISTORY — DX: Essential (primary) hypertension: I10

## 2013-09-10 NOTE — ED Provider Notes (Signed)
Medical screening examination/treatment/procedure(s) were performed by non-physician practitioner and as supervising physician I was immediately available for consultation/collaboration.   EKG Interpretation None        Malvin Johns, MD 09/10/13 2336

## 2013-09-10 NOTE — Discharge Instructions (Signed)
Keep your wound clean and dry. Please followup with your doctor tomorrow about your tetanus as discussed. Return for any changing or worsening symptoms.    Laceration Care, Adult A laceration is a cut or lesion that goes through all layers of the skin and into the tissue just beneath the skin. TREATMENT  Some lacerations may not require closure. Some lacerations may not be able to be closed due to an increased risk of infection. It is important to see your caregiver as soon as possible after an injury to minimize the risk of infection and maximize the opportunity for successful closure. If closure is appropriate, pain medicines may be given, if needed. The wound will be cleaned to help prevent infection. Your caregiver will use stitches (sutures), staples, wound glue (adhesive), or skin adhesive strips to repair the laceration. These tools bring the skin edges together to allow for faster healing and a better cosmetic outcome. However, all wounds will heal with a scar. Once the wound has healed, scarring can be minimized by covering the wound with sunscreen during the day for 1 full year. HOME CARE INSTRUCTIONS  For sutures or staples:  Keep the wound clean and dry.  If you were given a bandage (dressing), you should change it at least once a day. Also, change the dressing if it becomes wet or dirty, or as directed by your caregiver.  Wash the wound with soap and water 2 times a day. Rinse the wound off with water to remove all soap. Pat the wound dry with a clean towel.  After cleaning, apply a thin layer of the antibiotic ointment as recommended by your caregiver. This will help prevent infection and keep the dressing from sticking.  You may shower as usual after the first 24 hours. Do not soak the wound in water until the sutures are removed.  Only take over-the-counter or prescription medicines for pain, discomfort, or fever as directed by your caregiver.  Get your sutures or staples  removed as directed by your caregiver. For skin adhesive strips:  Keep the wound clean and dry.  Do not get the skin adhesive strips wet. You may bathe carefully, using caution to keep the wound dry.  If the wound gets wet, pat it dry with a clean towel.  Skin adhesive strips will fall off on their own. You may trim the strips as the wound heals. Do not remove skin adhesive strips that are still stuck to the wound. They will fall off in time. For wound adhesive:  You may briefly wet your wound in the shower or bath. Do not soak or scrub the wound. Do not swim. Avoid periods of heavy perspiration until the skin adhesive has fallen off on its own. After showering or bathing, gently pat the wound dry with a clean towel.  Do not apply liquid medicine, cream medicine, or ointment medicine to your wound while the skin adhesive is in place. This may loosen the film before your wound is healed.  If a dressing is placed over the wound, be careful not to apply tape directly over the skin adhesive. This may cause the adhesive to be pulled off before the wound is healed.  Avoid prolonged exposure to sunlight or tanning lamps while the skin adhesive is in place. Exposure to ultraviolet light in the first year will darken the scar.  The skin adhesive will usually remain in place for 5 to 10 days, then naturally fall off the skin. Do not pick at the  adhesive film. You may need a tetanus shot if:  You cannot remember when you had your last tetanus shot.  You have never had a tetanus shot. If you get a tetanus shot, your arm may swell, get red, and feel warm to the touch. This is common and not a problem. If you need a tetanus shot and you choose not to have one, there is a rare chance of getting tetanus. Sickness from tetanus can be serious. SEEK MEDICAL CARE IF:   You have redness, swelling, or increasing pain in the wound.  You see a red line that goes away from the wound.  You have  yellowish-white fluid (pus) coming from the wound.  You have a fever.  You notice a bad smell coming from the wound or dressing.  Your wound breaks open before or after sutures have been removed.  You notice something coming out of the wound such as wood or glass.  Your wound is on your hand or foot and you cannot move a finger or toe. SEEK IMMEDIATE MEDICAL CARE IF:   Your pain is not controlled with prescribed medicine.  You have severe swelling around the wound causing pain and numbness or a change in color in your arm, hand, leg, or foot.  Your wound splits open and starts bleeding.  You have worsening numbness, weakness, or loss of function of any joint around or beyond the wound.  You develop painful lumps near the wound or on the skin anywhere on your body. MAKE SURE YOU:   Understand these instructions.  Will watch your condition.  Will get help right away if you are not doing well or get worse. Document Released: 03/28/2005 Document Revised: 06/20/2011 Document Reviewed: 09/21/2010 [facility] Patient Information 2014 [place], [place].

## 2013-09-10 NOTE — ED Provider Notes (Signed)
CSN: [id number]     Arrival date & time 09/10/13  2119 History  This chart was scribed for non-physician practitioner, Hazel Sams, PA-C working with Malvin Johns, MD by Frederich Balding, ED scribe. This patient was seen in room WTR8/WTR8 and the patient's care was started at 10:47 PM.   Chief Complaint  Patient presents with  . Extremity Laceration   The history is provided by the patient. No language interpreter was used.   HPI Comments: Lucas Murphy is a 68 y.o. male who presents to the Emergency Department complaining of left index finger laceration that occurred prior to arrival. States he was trying to cut a lemon and accidentally cut his finger. Pt put alcohol on his finger after he cut it. Denies any weakness, reduced ROM or numbness. Currently takes aspirin daily. Pt is unsure of when his last tetanus was but thinks he is up to date.   Past Medical History  Diagnosis Date  . Other and unspecified hyperlipidemia   . Allergic rhinitis, cause unspecified   . Hypertension    Past Surgical History  Procedure Laterality Date  . Appendectomy    . Hemorroidectomy     Family History  Problem Relation Age of Onset  . Diabetes Father   . Dementia Father   . Dementia    . Dementia Mother    History  Substance Use Topics  . Smoking status: Former Smoker -- 0.10 packs/day for 8 years    Types: Cigarettes    Quit date: 04/11/1980  . Smokeless tobacco: Never Used  . Alcohol Use: Yes     Comment: 2-3 weekly    Review of Systems  Skin: Positive for wound.  All other systems reviewed and are negative.  Allergies  Review of patient's allergies indicates no known allergies.  Home Medications   Prior to Admission medications   Medication Sig Start Date End Date Taking? Authorizing Provider  aspirin 81 MG tablet Take 81 mg by mouth daily.     Yes Historical Provider, MD  beclomethasone (QVAR) 80 MCG/ACT inhaler INHALE 2 PUFFS INTO THE LUNG 2 TIMES DAILY 12/26/12  Yes Kathee Delton,  MD  chlorpheniramine (CHLOR-TRIMETON) 4 MG tablet 2 tablets at bedtime.  And also takes 1 tab daily .   Yes Historical Provider, MD  fluticasone (FLONASE) 50 MCG/ACT nasal spray Place 2 sprays into the nose daily.  02/11/11  Yes Historical Provider, MD   BP 174/87  Pulse 53  Temp(Src) 98.3 F (36.8 C) (Oral)  Resp 16  Ht 5' 11.5" (1.816 m)  Wt 172 lb (78.019 kg)  BMI 23.66 kg/m2  SpO2 99%  Physical Exam  Nursing note and vitals reviewed. Constitutional: He is oriented to person, place, and time. He appears well-developed and well-nourished. No distress.  HENT:  Head: Normocephalic and atraumatic.  Eyes: Conjunctivae and EOM are normal.  Neck: Normal range of motion. No tracheal deviation present.  Cardiovascular: Normal rate.   Pulmonary/Chest: Effort normal. No respiratory distress.  Musculoskeletal: Normal range of motion.  Neurological: He is alert and oriented to person, place, and time.  Skin: Skin is warm and dry.  2.5 cm linear laceration to left index finger.   Psychiatric: He has a normal mood and affect. His behavior is normal.    ED Course  Procedures   DIAGNOSTIC STUDIES: Oxygen Saturation is 99% on RA, normal by my interpretation.    COORDINATION OF CARE: 10:49 PM-Discussed treatment plan which includes laceration repair with pt at bedside and  pt agreed to plan. Pt states he will check with his PCP tomorrow to see when his last tetanus was.   LACERATION REPAIR Performed by: Martie Lee Authorized by: Martie Lee Consent: Verbal consent obtained. Risks and benefits: risks, benefits and alternatives were discussed Consent given by: patient Patient identity confirmed: provided demographic data Prepped and Draped in normal sterile fashion Wound explored  Laceration Location: left index finger  Laceration Length: 2.5cm  No Foreign Bodies seen or palpated  Anesthesia: none  Irrigation method: syringe Amount of cleaning: standard  Skin closure: 1  steri strip  Patient tolerance: Patient tolerated the procedure well with no immediate complications.    MDM   Final diagnoses:  Laceration of finger, left      I personally performed the services described in this documentation, which was scribed in my presence. The recorded information has been reviewed and is accurate.    Martie Lee, PA-C 09/10/13 2309

## 2013-09-10 NOTE — ED Notes (Signed)
Pt cut L index finger with knife while working in the kitchen. No active bleeding at this time. About 1 inch laceration noted.

## 2013-09-17 ENCOUNTER — Other Ambulatory Visit: Payer: Self-pay | Admitting: Pulmonary Disease

## 2013-09-20 ENCOUNTER — Telehealth: Payer: Self-pay | Admitting: Pulmonary Disease

## 2013-09-20 MED ORDER — BECLOMETHASONE DIPROPIONATE 80 MCG/ACT IN AERS: INHALATION_SPRAY | RESPIRATORY_TRACT | Status: DC

## 2013-09-20 NOTE — Telephone Encounter (Signed)
Called and spoke with pt and he stated that he ran out of his qvar and has ordered it but it will take 7-10 days to get to him.  Wanted to see if we had a sample that he could come and pick up today.  Pt is aware of sample that has been left up front and he will stop by today to pick this up.  Nothing further is needed.

## 2013-10-16 ENCOUNTER — Telehealth: Payer: Self-pay | Admitting: Diagnostic Neuroimaging

## 2013-10-16 NOTE — Telephone Encounter (Signed)
Called patient regarding rescheduling 10/18/13 appointment per Dr. Gladstone Lighter schedule, spoke to wife since number listed for patient has a voicemail that hasn't been set up yet. Wife states that she would like a sooner appointment because they haven't discussed anything with the doctor regarding his tests. Please return call to patient's wife and advise.

## 2013-10-17 NOTE — Telephone Encounter (Signed)
Called and left VM message to return call

## 2013-10-17 NOTE — Telephone Encounter (Signed)
Spoke with wife and informed that we will place patient on wait list for possibly sooner appt. She verbalized understanding but still wanted something sooner that 11/01/13, said that no one had contacted them with test results from 07/26/13.

## 2013-10-17 NOTE — Telephone Encounter (Addendum)
I called and spoke to wife.   Gave her results.  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  Dr. Leta Baptist to go over more specifics when in for RV.  Dr Krista Blue on 09-09-13 did call and give results to pt.  Moved up appt for 10-23-13 at 0900.   Wife verbalized understanding.

## 2013-10-18 ENCOUNTER — Ambulatory Visit: Payer: PRIVATE HEALTH INSURANCE | Admitting: Diagnostic Neuroimaging

## 2013-10-23 ENCOUNTER — Encounter (INDEPENDENT_AMBULATORY_CARE_PROVIDER_SITE_OTHER): Payer: Self-pay

## 2013-10-23 ENCOUNTER — Ambulatory Visit (INDEPENDENT_AMBULATORY_CARE_PROVIDER_SITE_OTHER): Payer: Medicare Other | Admitting: Diagnostic Neuroimaging

## 2013-10-23 ENCOUNTER — Encounter: Payer: Self-pay | Admitting: Diagnostic Neuroimaging

## 2013-10-23 VITALS — BP 154/81 | HR 57 | Temp 97.5°F | Ht 71.0 in | Wt 172.2 lb

## 2013-10-23 DIAGNOSIS — G3184 Mild cognitive impairment, so stated: Secondary | ICD-10-CM | POA: Diagnosis not present

## 2013-10-23 DIAGNOSIS — R413 Other amnesia: Secondary | ICD-10-CM | POA: Diagnosis not present

## 2013-10-23 NOTE — Progress Notes (Signed)
[facility]  PATIENT: Lucas Murphy DOB: 11/04/1945  REFERRING CLINICIAN: Mady Haagensen HISTORY FROM: patient and wife REASON FOR VISIT: follow up   HISTORICAL  CHIEF COMPLAINT:  Chief Complaint  Patient presents with  . Follow-up    memory loss    HISTORY OF PRESENT ILLNESS:   UPDATE 10/23/13: Since last visit, sxs are stable. 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 She has noted that patient has: short term memory problems, asking her to repeat things, word finding diff, problems with reasoning and problem sovling. Apparently he [place] [address] last year. He has trouble multitasking (talking and driving) and he tends to slow down below speed limit and wander on the road. Not having diff with finances.   PRIOR HPI (07/26/13): 68 year old right-handed male here for evaluation of memory loss and cognitive difficulty. For past 2 years patient has had intermittent episodes of forgetting recent events, having to take more notes, word hesitancy and word finding difficulty. This is noticed by himself and his wife. Patient retired in 2011 and moved to [place]. Since that time he's been very active physically and socially. However he is concerned about his gradually progressive cognitive issues. Patient's mother and father both had dementia. No change in smell or taste. No vivid dreams. No punching or kicking in sleep. No balance or walking difficulty. No syncope, chest pain or shortness of breath. Otherwise patient is able to maintain all of his activities of daily living. 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  REVIEW OF SYSTEMS: Full 14 system review of systems performed and notable only for mild snoring mild memory loss.  ALLERGIES: No Known Allergies  HOME MEDICATIONS: Outpatient Prescriptions Prior to Visit  Medication Sig Dispense Refill  . aspirin 81 MG tablet  Take 81 mg by mouth daily.        . beclomethasone (QVAR) 80 MCG/ACT inhaler Inhale 2 puffs into the  lungs two times daily  8.7 g  0  . chlorpheniramine (CHLOR-TRIMETON) 4 MG tablet 2 tablets at bedtime.  And also takes 1 tab daily .      . fluticasone (FLONASE) 50 MCG/ACT nasal spray Place 2 sprays into the nose daily.        No facility-administered medications prior to visit.    PAST MEDICAL HISTORY: Past Medical History  Diagnosis Date  . Other and unspecified hyperlipidemia   . Allergic rhinitis, cause unspecified   . Hypertension     PAST SURGICAL HISTORY: Past Surgical History  Procedure Laterality Date  . Appendectomy    . Hemorroidectomy      FAMILY HISTORY: Family History  Problem Relation Age of Onset  . Diabetes Father   . Dementia Father   . Dementia    . Dementia Mother     SOCIAL HISTORY:  History   Social History  . Marital Status: Married    Spouse Name: [name]    Number of Children: 2  . Years of Education: College   Occupational History  . Retired   .     Social History Main Topics  . Smoking status: Former Smoker -- 0.10 packs/day for 8 years    Types: Cigarettes    Quit date: 04/11/1980  . Smokeless tobacco: Never Used  . Alcohol Use: Yes     Comment: 2-3 weekly  . Drug Use: No  . Sexual Activity: Not on file   Other Topics  Concern  . Not on file   Social History Narrative   Patient lives at home with wife.   Caffeine use: 2-3 cups daily     PHYSICAL EXAM  Filed Vitals:   10/23/13 0851  BP: 154/81  Pulse: 57  Temp: 97.5 F (36.4 C)  TempSrc: Oral  Height: 5\' 11"  (1.803 m)  Weight: 172 lb 3.2 oz (78.109 kg)    Not recorded    Body mass index is 24.03 kg/(m^2).  GENERAL EXAM: Patient is in no distress; well developed, nourished and groomed; neck is supple  CARDIOVASCULAR: Regular rate and rhythm, no murmurs, no carotid bruits  NEUROLOGIC: MENTAL STATUS: awake, alert, oriented to person, place and time, recent  and remote memory intact, normal attention and concentration, language fluent, comprehension intact, naming intact, fund of knowledge appropriate; NO FRONTAL RELEASE SIGNS. CRANIAL NERVE: no papilledema on fundoscopic exam, pupils equal and reactive to light, visual fields full to confrontation, extraocular muscles intact, no nystagmus, facial sensation and strength symmetric, hearing intact, palate elevates symmetrically, uvula midline, shoulder shrug symmetric, tongue midline. MOTOR: normal bulk and tone, full strength in the BUE, BLE SENSORY: normal and symmetric to light touch, temperature, vibration COORDINATION: finger-nose-finger, fine finger movements normal REFLEXES: deep tendon reflexes present and symmetric GAIT/STATION: narrow based gait; able to walk on toes, heels and tandem; romberg is negative    DIAGNOSTIC DATA (LABS, IMAGING, TESTING) - I reviewed patient records, labs, notes, testing and imaging myself where available.  No results found for this basename: WBC,  HGB,  HCT,  MCV,  PLT   No results found for this basename: na,  k,  cl,  co2,  glucose,  bun,  creatinine,  calcium,  prot,  albumin,  ast,  alt,  alkphos,  bilitot,  gfrnonaa,  gfraa   No results found for this basename: CHOL,  HDL,  LDLCALC,  LDLDIRECT,  TRIG,  CHOLHDL   No results found for this basename: HGBA1C   Lab Results  Component Value Date   VITAMINB12 738 07/26/2013   No results found for this basename: TSH    08/21/13 MRI BRAIN: 1. 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  2. Mild scattered periventricular and subcortical foci of T2 hyperintensities; especially near the right peri-atrial region. Few punctate chronic cerebral microhemorrhages in the right parietal and temporal regions. These findings are non-specific and considerations include chronic microvascular ischemic disease, autoimmune, inflammatory or post-infectious etiologies.   08/02/13 NEUROTRAX testing:  - Global cognitive  score: More than one standard deviation below-average - Memory, verbal function: More than one standard deviation below-average - Executive function, attention, information processing speed, visual spatial: Below-average - Motor skills: Above average   ASSESSMENT AND PLAN  68 y.o. year old male here with subjective memory loss word finding difficulties over past one to 2 years. Neurologic examination is unremarkable (MMSE 28/30). Neurotrax cognitive testing shows significant deficits in memory and verbal function.  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   Penni Bombard, MD 5/78/4696, 2:95 AM Certified in Neurology, Neurophysiology and Neuroimaging  [facility] [address], [place],  [address] [phone]

## 2013-10-23 NOTE — Patient Instructions (Signed)
Continue physical activity.   Try new cognitively stimulating activities.

## 2013-11-01 ENCOUNTER — Ambulatory Visit: Payer: PRIVATE HEALTH INSURANCE | Admitting: Diagnostic Neuroimaging

## 2013-11-25 ENCOUNTER — Telehealth: Payer: Self-pay | Admitting: Pulmonary Disease

## 2013-11-25 NOTE — Telephone Encounter (Signed)
Called made pt aware of [place] response. Nothing further needed

## 2013-11-25 NOTE — Telephone Encounter (Signed)
qvar does NOT cause memory loss chlortabs can cause sleepiness, and sometimes confusion, but not memory loss.

## 2013-11-25 NOTE — Telephone Encounter (Signed)
Called spoke with pt. He saw neuologist Dr. Leta Baptist d/t memory loss. He saw [facility] advertising having study for ppl w/ memory loss. He called them to see if he would be a candidate.  He was told his QVAR 80 mc and chlortrimeton tends to affect memory loss and he would not be able to be in there study. Pt is wanting to know if these 2 medications can cause this?please advise [place] thanks

## 2013-12-23 DIAGNOSIS — Z23 Encounter for immunization: Secondary | ICD-10-CM | POA: Diagnosis not present

## 2014-02-21 ENCOUNTER — Telehealth: Payer: Self-pay | Admitting: *Deleted

## 2014-02-21 NOTE — Telephone Encounter (Signed)
Can change to flovent HFA 110, take 2 puffs twice a day and rinse mouth well.

## 2014-02-21 NOTE — Telephone Encounter (Signed)
Received fax that for pt plan year 2016, QVAR no longer covered under plan. Alternative is flovent. Please advise [place] thanks

## 2014-02-24 NOTE — Telephone Encounter (Signed)
lmomtcb x1 

## 2014-02-25 NOTE — Telephone Encounter (Signed)
lmomtcb x 2  

## 2014-02-26 NOTE — Telephone Encounter (Signed)
Pt returning call.Lucas Murphy ° °

## 2014-02-26 NOTE — Telephone Encounter (Signed)
Called spoke with pt. Aware of below. He will call in December to get RX called into mail order.  Nothing further needed

## 2014-03-21 ENCOUNTER — Encounter: Payer: Self-pay | Admitting: Diagnostic Neuroimaging

## 2014-03-25 ENCOUNTER — Encounter: Payer: Self-pay | Admitting: Diagnostic Neuroimaging

## 2014-04-16 DIAGNOSIS — J309 Allergic rhinitis, unspecified: Secondary | ICD-10-CM | POA: Diagnosis not present

## 2014-05-07 ENCOUNTER — Telehealth: Payer: Self-pay | Admitting: *Deleted

## 2014-05-07 NOTE — Telephone Encounter (Signed)
Spoke to pt and rescheduled appt

## 2014-05-22 ENCOUNTER — Telehealth: Payer: Self-pay | Admitting: Pulmonary Disease

## 2014-05-22 MED ORDER — FLUTICASONE PROPIONATE HFA 110 MCG/ACT IN AERO: 2.0000 | INHALATION_SPRAY | Freq: Two times a day (BID) | RESPIRATORY_TRACT | Status: DC

## 2014-05-22 NOTE — Telephone Encounter (Signed)
Per 02/21/14 phone note: Kathee Delton, MD at 02/21/2014 5:11 PM     Status: Signed       Expand All Collapse All   Can change to flovent HFA 110, take 2 puffs twice a day and rinse mouth well.             Inge Rise, CMA at 02/21/2014 4:07 PM     Status: Signed       Expand All Collapse All   Received fax that for pt plan year 2016, QVAR no longer covered under plan. Alternative is flovent. Please advise [place] thanks      --  Called pt and he needs the flovent sent in now. I have done so. Nothing further needed

## 2014-07-21 DIAGNOSIS — Z79899 Other long term (current) drug therapy: Secondary | ICD-10-CM | POA: Diagnosis not present

## 2014-07-21 DIAGNOSIS — Z0000 Encounter for general adult medical examination without abnormal findings: Secondary | ICD-10-CM | POA: Diagnosis not present

## 2014-07-21 DIAGNOSIS — E780 Pure hypercholesterolemia: Secondary | ICD-10-CM | POA: Diagnosis not present

## 2014-07-21 DIAGNOSIS — I10 Essential (primary) hypertension: Secondary | ICD-10-CM | POA: Diagnosis not present

## 2014-07-21 DIAGNOSIS — Z1389 Encounter for screening for other disorder: Secondary | ICD-10-CM | POA: Diagnosis not present

## 2014-07-21 DIAGNOSIS — J45909 Unspecified asthma, uncomplicated: Secondary | ICD-10-CM | POA: Diagnosis not present

## 2014-07-21 DIAGNOSIS — G3184 Mild cognitive impairment, so stated: Secondary | ICD-10-CM | POA: Diagnosis not present

## 2014-07-21 DIAGNOSIS — Z125 Encounter for screening for malignant neoplasm of prostate: Secondary | ICD-10-CM | POA: Diagnosis not present

## 2014-08-25 ENCOUNTER — Ambulatory Visit: Payer: Medicare Other | Admitting: Diagnostic Neuroimaging

## 2014-08-27 ENCOUNTER — Encounter: Payer: Self-pay | Admitting: Diagnostic Neuroimaging

## 2014-08-27 ENCOUNTER — Ambulatory Visit (INDEPENDENT_AMBULATORY_CARE_PROVIDER_SITE_OTHER): Payer: Medicare Other | Admitting: Diagnostic Neuroimaging

## 2014-08-27 VITALS — BP 162/95 | HR 57 | Ht 71.0 in | Wt 171.0 lb

## 2014-08-27 DIAGNOSIS — R413 Other amnesia: Secondary | ICD-10-CM

## 2014-08-27 DIAGNOSIS — G3184 Mild cognitive impairment, so stated: Secondary | ICD-10-CM | POA: Diagnosis not present

## 2014-08-27 NOTE — Progress Notes (Signed)
[facility]  PATIENT: Lucas Murphy DOB: 1946/01/27  REFERRING CLINICIAN: Mady Haagensen HISTORY FROM: patient and wife REASON FOR VISIT: follow up   HISTORICAL  CHIEF COMPLAINT:  Chief Complaint  Patient presents with  . Follow-up    memory loss     HISTORY OF PRESENT ILLNESS:   UPDATE 08/27/14: Since last visit, doing about the same per patient, but wife thinks he has declined. He is having diff with word finding, TV comprehension, conversational comprehension, word substitutions. He feels less comfortable in certain social situations. Constantly asking wife the same questions over and over. Difficulty with cleaning the vacuum cleaner.   UPDATE 10/23/13: Since last visit, sxs are stable. 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 She has noted that patient has: short term memory problems, asking her to repeat things, word finding diff, problems with reasoning and problem sovling. Apparently he [place] [address] [place]. He has trouble multitasking (talking and driving) and he tends to slow down below speed limit and wander on the road. Not having diff with finances.   PRIOR HPI (07/26/13): 69 year old right-handed male here for evaluation of memory loss and cognitive difficulty. For past 2 years patient has had intermittent episodes of forgetting recent events, having to take more notes, word hesitancy and word finding difficulty. This is noticed by himself and his wife. Patient retired in 2011 and moved to [place]. Since that time he's been very active physically and socially. However he is concerned about his gradually progressive cognitive issues. Patient's mother and father both had dementia. No change in smell or taste. No vivid dreams. No punching or kicking in sleep. No balance or walking difficulty. No syncope, chest pain or shortness of breath. Otherwise patient is able to maintain all of his activities of daily living. 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  REVIEW OF SYSTEMS: Full 14 system review of systems performed and notable only for speech diff.  ALLERGIES: No Known Allergies  HOME MEDICATIONS: Outpatient Prescriptions Prior to Visit  Medication Sig Dispense Refill  . aspirin 81 MG tablet Take 81 mg by mouth daily.      . chlorpheniramine (CHLOR-TRIMETON) 4 MG tablet 2 tablets at bedtime.  And also takes 1 tab daily .    . fluticasone (FLONASE) 50 MCG/ACT nasal spray Place 2 sprays into the nose daily.     . fluticasone (FLOVENT HFA) 110 MCG/ACT inhaler Inhale 2 puffs into the lungs 2 (two) times daily. 3 Inhaler 3  . lisinopril (PRINIVIL,ZESTRIL) 10 MG tablet Take 1 tablet by mouth daily.    . beclomethasone (QVAR) 80 MCG/ACT inhaler Inhale 2 puffs into the  lungs two times daily 8.7 g 0   No facility-administered medications prior to visit.    PAST MEDICAL HISTORY: Past Medical History  Diagnosis Date  . Other and unspecified hyperlipidemia   . Allergic rhinitis, cause unspecified   . Hypertension     PAST SURGICAL HISTORY: Past Surgical History  Procedure Laterality Date  . Appendectomy    . Hemorroidectomy      FAMILY HISTORY: Family History  Problem Relation Age of Onset  . Diabetes Father   . Dementia Father   . Dementia    . Dementia Mother     SOCIAL HISTORY:  History   Social History  . Marital Status: Married    Spouse Name: Lucas Murphy  . Number of Children: 2  .  Years of Education: College   Occupational History  . Retired   .     Social History Main Topics  . Smoking status: Former Smoker -- 0.10 packs/day for 8 years    Types: Cigarettes    Quit date: 04/11/1980  . Smokeless tobacco: Never Used  . Alcohol Use: Yes     Comment: 2-3 weekly  . Drug Use: No  . Sexual Activity: Not on file   Other Topics Concern  . Not on file   Social History Narrative   Patient lives at home with wife.    Caffeine use: 2-3 cups daily     PHYSICAL EXAM  Filed Vitals:   08/27/14 1318  BP: 162/95  Pulse: 57  Height: 5\' 11"  (1.803 m)  Weight: 171 lb (77.565 kg)    Not recorded      Body mass index is 23.86 kg/(m^2).  MMSE - Mini Mental State Exam 08/27/2014  Orientation to time 5  Orientation to Place 5  Registration 3  Attention/ Calculation 5  Recall 0  Language- name 2 objects 2  Language- repeat 1  Language- follow 3 step command 3  Language- read & follow direction 1  Write a sentence 1  Copy design 0  Total score 26     GENERAL EXAM: Patient is in no distress; well developed, nourished and groomed; neck is supple  CARDIOVASCULAR: Regular rate and rhythm, no murmurs, no carotid bruits  NEUROLOGIC: MENTAL STATUS: awake, alert, language fluent, comprehension intact, naming intact, fund of knowledge appropriate; NO FRONTAL RELEASE SIGNS. ABNORMAL CLOCK DRAWING. ABNORMAL PENTAGON COPY. 0/3 RECALL CRANIAL NERVE: no papilledema on fundoscopic exam, pupils equal and reactive to light, visual fields full to confrontation, extraocular muscles intact, no nystagmus, facial sensation and strength symmetric, hearing intact, palate elevates symmetrically, uvula midline, shoulder shrug symmetric, tongue midline. MOTOR: normal bulk and tone, full strength in the BUE, BLE SENSORY: normal and symmetric to light touch, temperature, vibration COORDINATION: finger-nose-finger, fine finger movements normal REFLEXES: deep tendon reflexes present and symmetric GAIT/STATION: narrow based gait; able to walk tandem; romberg is negative    DIAGNOSTIC DATA (LABS, IMAGING, TESTING) - I reviewed patient records, labs, notes, testing and imaging myself where available.  No results found for: WBC No results found for: NA No results found for: CHOL No results found for: HGBA1C Lab Results  Component Value Date   VITAMINB12 738 07/26/2013   No results found for: TSH  08/21/13 MRI  BRAIN: 1. 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  2. Mild scattered periventricular and subcortical foci of T2 hyperintensities; especially near the right peri-atrial region. Few punctate chronic cerebral microhemorrhages in the right parietal and temporal regions. These findings are non-specific and considerations include chronic microvascular ischemic disease, autoimmune, inflammatory or post-infectious etiologies.   08/02/13 NEUROTRAX testing:  - Global cognitive score: More than one standard deviation below-average - Memory, verbal function: More than one standard deviation below-average - Executive function, attention, information processing speed, visual spatial: Below-average - Motor skills: Above average   ASSESSMENT AND PLAN  69 y.o. year old male here with subjective memory loss word finding difficulties over past one to 2 years. Neurologic examination is unremarkable except for MMSE 26/30 and abnormal clock drawing. Prior Neurotrax cognitive testing shows significant deficits in memory and verbal function.  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    Penni Bombard, MD 7/51/0258, 5:27 PM Certified in Neurology, Neurophysiology and Neuroimaging  [facility] [address], [place], [place] [employer] [address] [phone]

## 2014-08-30 DIAGNOSIS — L237 Allergic contact dermatitis due to plants, except food: Secondary | ICD-10-CM | POA: Diagnosis not present

## 2014-09-01 ENCOUNTER — Encounter: Payer: Self-pay | Admitting: *Deleted

## 2014-09-01 ENCOUNTER — Telehealth: Payer: Self-pay | Admitting: Diagnostic Neuroimaging

## 2014-09-01 NOTE — Telephone Encounter (Signed)
Pt called wanting the nurse Aldona Bar) to know that he is currently taking Flovent HFA 192mcg.  Pt can be reached at  [phone].

## 2014-09-01 NOTE — Telephone Encounter (Signed)
Noted  

## 2014-09-16 DIAGNOSIS — L237 Allergic contact dermatitis due to plants, except food: Secondary | ICD-10-CM | POA: Diagnosis not present

## 2014-10-06 DIAGNOSIS — R21 Rash and other nonspecific skin eruption: Secondary | ICD-10-CM | POA: Diagnosis not present

## 2014-10-08 ENCOUNTER — Encounter (INDEPENDENT_AMBULATORY_CARE_PROVIDER_SITE_OTHER): Payer: Self-pay

## 2014-10-08 DIAGNOSIS — Z0289 Encounter for other administrative examinations: Secondary | ICD-10-CM

## 2014-10-15 ENCOUNTER — Telehealth: Payer: Self-pay

## 2014-10-15 NOTE — Telephone Encounter (Signed)
I left a message for the patient to return my call.

## 2014-10-16 ENCOUNTER — Telehealth: Payer: Self-pay

## 2014-10-16 NOTE — Telephone Encounter (Signed)
I spoke to the patient's wife, Chanceler Pullin, to let her know that Antonios does no qualify for the CREAD study. I thanked her for the time and interest.

## 2014-10-29 ENCOUNTER — Telehealth: Payer: Self-pay | Admitting: Pulmonary Disease

## 2014-10-29 NOTE — Telephone Encounter (Signed)
Called spoke with pt. He is scheduled to see Dr. Ashok Cordia at 2pm next week 7/27. Nothing further needed

## 2014-11-05 ENCOUNTER — Ambulatory Visit (INDEPENDENT_AMBULATORY_CARE_PROVIDER_SITE_OTHER): Payer: Medicare Other | Admitting: Pulmonary Disease

## 2014-11-05 ENCOUNTER — Encounter: Payer: Self-pay | Admitting: Pulmonary Disease

## 2014-11-05 ENCOUNTER — Other Ambulatory Visit: Payer: Medicare Other

## 2014-11-05 VITALS — BP 138/74 | HR 57 | Temp 97.9°F | Ht 72.0 in | Wt 169.8 lb

## 2014-11-05 DIAGNOSIS — R05 Cough: Secondary | ICD-10-CM

## 2014-11-05 DIAGNOSIS — J309 Allergic rhinitis, unspecified: Secondary | ICD-10-CM

## 2014-11-05 DIAGNOSIS — J449 Chronic obstructive pulmonary disease, unspecified: Secondary | ICD-10-CM | POA: Diagnosis not present

## 2014-11-05 DIAGNOSIS — R059 Cough, unspecified: Secondary | ICD-10-CM

## 2014-11-05 DIAGNOSIS — I10 Essential (primary) hypertension: Secondary | ICD-10-CM | POA: Insufficient documentation

## 2014-11-05 NOTE — Patient Instructions (Signed)
1. We are stopping her Flovent HFA inhaler today to see if this helps with her cough. Please contact my office if her cough does not resolve or worsens before your next appointment. 2. We are going to check breathing test to examine how severe your asthma/COPD is. 3. I am checking blood tests today for the genetic form of COPD as well as allergies. We will review these at her next appointment. 4. He will return to clinic in 2-3 months or sooner if needed. Please do not hesitate to contact my office if you have any further questions or concerns.

## 2014-11-05 NOTE — Progress Notes (Signed)
Subjective:    Patient ID: Lucas Murphy, male    DOB: Jul 27, 1945, 69 y.o.   MRN: [id number]  HPI Cough:  He reports he has had a cough ever since he was switched to Flovent [facility] and was switched in November 2015 from Qvar. He feels the cough seems to be progressing slightly. He denies any nocturnal awakenings with coughing. He reports he coughs sporadically, also while drinking liquids. He reports he produces a clear phlegm. He denies any hemoptysis. He reports he has only been using his Flovent HFA 171mcg one puff twice daily. He has no rescue inhaler.   Asthma:  He reports he has been told he had "light" asthma. He denies any significant dyspnea. He denies any wheezing. He cannot recall his last exacerbation. He denies any chest tightness or pain. He is exercising 3 times weekly and also plays 18 holes of golf a couple of times a week.   Allergic Rhinitis:  He denies any problems with sinus congestion or post-nasal drainage. He uses his Flonase on a daily basis.    Review of Systems No dyspepsia, reflux, or morning brash water taste. No fever, chills, or sweats.  No Known Allergies  Current Outpatient Prescriptions on File Prior to Visit  Medication Sig Dispense Refill  . aspirin 81 MG tablet Take 81 mg by mouth daily.      . chlorpheniramine (CHLOR-TRIMETON) 4 MG tablet 1 tablet every AM and 1 at bedtime    . fluticasone (FLONASE) 50 MCG/ACT nasal spray Place 2 sprays into the nose daily.     . fluticasone (FLOVENT HFA) 110 MCG/ACT inhaler Inhale 2 puffs into the lungs 2 (two) times daily. (Patient taking differently: Inhale 1 puff into the lungs 2 (two) times daily. ) 3 Inhaler 3  . lisinopril (PRINIVIL,ZESTRIL) 10 MG tablet Take 1 tablet by mouth daily.     No current facility-administered medications on file prior to visit.   Past Medical History  Diagnosis Date  . Other and unspecified hyperlipidemia   . Allergic rhinitis, cause unspecified   . Hypertension   . Asthma    Past  Surgical History  Procedure Laterality Date  . Appendectomy    . Hemorroidectomy    . Tonsillectomy    . Colonoscopy     Family History  Problem Relation Age of Onset  . Diabetes Father   . Dementia Father   . Dementia Mother   . Lung disease Neg Hx    History   Social History  . Marital Status: Married    Spouse Name: [name]  . Number of Children: 2  . Years of Education: College   Occupational History  . Retired   .     Social History Main Topics  . Smoking status: Former Smoker -- 0.10 packs/day for 8 years    Types: Cigarettes    Quit date: 04/11/1980  . Smokeless tobacco: Never Used  . Alcohol Use: 0.0 oz/week    0 Standard drinks or equivalent per week     Comment: beer 1-2 times a week, occas wine  . Drug Use: No  . Sexual Activity: Not on file   Other Topics Concern  . None   Social History Narrative   Patient lives at home with wife.   Caffeine use: 2-3 cups daily       Objective:   Physical Exam Blood pressure 138/74, pulse 57, temperature 97.9 F (36.6 C), temperature source Oral, height 6' (1.829 m), weight 169 lb 12.8  oz (77.021 kg), SpO2 96 %. General:  Awake. Alert. No acute distress. Thin, Caucasian male.  Integument:  Warm & dry. No rash on exposed skin. No bruising. Lymphatics:  No appreciated cervical or supraclavicular lymphadenoapthy. HEENT:  Moist mucus membranes. No oral ulcers. No scleral injection or icterus. PERRL. Cardiovascular:  Regular rate. No edema. No appreciable JVD.  Pulmonary:  Good aeration & clear to auscultation bilaterally. Symmetric chest wall expansion. No accessory muscle use. Abdomen: Soft. Normal bowel sounds. Nondistended. Grossly nontender. Musculoskeletal:  Normal bulk and tone. Hand grip strength 5/5 bilaterally. No joint deformity or effusion appreciated. Neurological:  CN 2-12 grossly in tact. No meningismus. Moving all 4 extremities equally. Symmetric patellar deep tendon reflexes. Psychiatric:  Mood and  affect congruent. Speech normal rhythm, rate & tone.   PFT 03/14/11: FVC 5.69 L (116%) FEV1 3.29 L (88%) FEV1/FVC 0.58 FEF 25-75 1.66 (50%)    Assessment & Plan:  The patient is a 69 year old male previously diagnosed with asthma and allergic rhinitis. He is completely asymptomatic with regards to his allergic rhinitis. I am suspicious that his cough may be induced by his Flovent inhaler. His asthma is otherwise quiesced and and at this time I am going to recommend discontinuing this inhaler to see if his symptoms resolve and  determine whether or not an alternative inhaler as necessary. in reviewing his prior pulmonary function testing there is mild airways obstruction on his spirometry with relatively minimal tobacco use in his history. I do believe it's worthwhile to screen him for alpha-1 antitrypsin deficiency as well as repeat pulmonary function testing to determine how his lung function is progressing. I reviewed all this with the patient today. I instructed him to contact my office for any further questions or concerns.   1. Cough: Suspect secondary to Flovent HFA inhaler. Discontinuing inhaler. Patient to contact my office if symptoms do not resolve or worsen. 2. Asthma with mild airways obstruction: Checking alpha-1 antitrypsin. Checking for pulmonary function testing before next appointment. 3. Allergic rhinitis: Asymptomatic on Flonase. Checking total IgE and serum RAST panel.   4. Follow-up: Patient to return to clinic in 2-3 months or sooner if needed.

## 2014-11-06 LAB — ALLERGY FULL PROFILE
Allergen, D pternoyssinus,d7: <0.1 kU/L
Alternaria Alternata: <0.1 kU/L
Aspergillus fumigatus, m3: <0.1 kU/L
Bahia Grass: <0.1 kU/L
Cat Dander: <0.1 kU/L
Common Ragweed: <0.1 kU/L
Curvularia lunata: <0.1 kU/L
D. farinae: <0.1 kU/L
Dog Dander: <0.1 kU/L
Elm IgE: <0.1 kU/L
Fescue: 0.2 kU/L — ABNORMAL HIGH
G005 Rye, Perennial: 0.21 kU/L — ABNORMAL HIGH
G009 Red Top: 0.22 kU/L — ABNORMAL HIGH
Goldenrod: <0.1 kU/L
Helminthosporium halodes: <0.1 kU/L
House Dust Hollister: <0.1 kU/L
IgE (Immunoglobulin E), Serum: 30 kU/L (ref <115)
Lamb's Quarters: <0.1 kU/L
Oak: <0.1 kU/L
Plantain: <0.1 kU/L
Stemphylium Botryosum: <0.1 kU/L
Sycamore Tree: <0.1 kU/L
Timothy Grass: 0.22 kU/L — ABNORMAL HIGH

## 2014-11-07 LAB — ALPHA-1-ANTITRYPSIN: A1 ANTITRYPSIN SER: 124 mg/dL (ref 83–199)

## 2014-11-18 ENCOUNTER — Telehealth: Payer: Self-pay | Admitting: Pulmonary Disease

## 2014-11-18 MED ORDER — FLUTICASONE PROPIONATE 50 MCG/ACT NA SUSP: 2.0000 | Freq: Every day | NASAL | Status: DC

## 2014-11-18 NOTE — Telephone Encounter (Signed)
Yes. That's fine.  

## 2014-11-18 NOTE — Telephone Encounter (Signed)
Called spoke with pt. He is wanting [place] to send RX to [facility] for flonase nasal spray. We did not prescribe this for him but Shirley did refill this in the past. Please advise Dr. Ashok Cordia if okay to refill?

## 2014-11-18 NOTE — Telephone Encounter (Signed)
Called spoke with pt and is aware RX sent in. Nothing further needed

## 2014-12-24 DIAGNOSIS — L03116 Cellulitis of left lower limb: Secondary | ICD-10-CM | POA: Diagnosis not present

## 2015-01-07 ENCOUNTER — Telehealth: Payer: Self-pay | Admitting: Pulmonary Disease

## 2015-01-07 NOTE — Telephone Encounter (Signed)
Spoke with pt. States when he was here to see JN, we stopped Flovent HFA. Since then he has been producing a lot of mucus in the back of throat. Denies chest tightness, SOB or wheezing. When he can cough up the mucus it is clear. Would like to know what to do.  JN - please advise. Thanks.

## 2015-01-09 NOTE — Telephone Encounter (Signed)
Spoke with the pt and notified of recs per Dr. Ashok Cordia  He verbalized understanding  Nothing further needed

## 2015-01-09 NOTE — Telephone Encounter (Signed)
Pt states increased mucus production in back of throat since stopping Flovent at last OV Pt denies chest tightness, SOB, or wheezing  Mucus is clear Pt would like JN to advise of what to do  Dr Lew Dawes, please advise. Thanks

## 2015-01-09 NOTE — Telephone Encounter (Signed)
Per Dr. Ashok Cordia, he can try going back to the flovent and see if this helps. If he develops any f/c/s/n/v or getting worse after going back then he is too let us know. thanks

## 2015-01-09 NOTE — Telephone Encounter (Signed)
Pt returning call.Lucas Murphy ° °

## 2015-01-09 NOTE — Telephone Encounter (Signed)
LMTCB

## 2015-01-09 NOTE — Telephone Encounter (Signed)
Pt cb to follow up [phone]

## 2015-01-12 DIAGNOSIS — Z23 Encounter for immunization: Secondary | ICD-10-CM | POA: Diagnosis not present

## 2015-02-09 ENCOUNTER — Ambulatory Visit (INDEPENDENT_AMBULATORY_CARE_PROVIDER_SITE_OTHER): Payer: Medicare Other | Admitting: Pulmonary Disease

## 2015-02-09 DIAGNOSIS — R05 Cough: Secondary | ICD-10-CM

## 2015-02-09 DIAGNOSIS — R059 Cough, unspecified: Secondary | ICD-10-CM

## 2015-02-09 DIAGNOSIS — J449 Chronic obstructive pulmonary disease, unspecified: Secondary | ICD-10-CM

## 2015-02-09 LAB — PULMONARY FUNCTION TEST
DL/VA % pred: 88 %
DL/VA: 4.16 ml/min/mmHg/L
DLCO UNC: 26.38 ml/min/mmHg
DLCO unc % pred: 75 %
FEF 25-75 PRE: 1.92 L/s
FEF 25-75 Post: 2.17 L/sec
FEF2575-%CHANGE-POST: 13 %
FEF2575-%Pred-Post: 83 %
FEF2575-%Pred-Pre: 73 %
FEV1-%CHANGE-POST: 1 %
FEV1-%Pred-Post: 100 %
FEV1-%Pred-Pre: 99 %
FEV1-POST: 3.48 L
FEV1-Pre: 3.43 L
FEV1FVC-%Change-Post: 1 %
FEV1FVC-%Pred-Pre: 92 %
FEV6-%CHANGE-POST: 0 %
FEV6-%PRED-POST: 111 %
FEV6-%PRED-PRE: 111 %
FEV6-Post: 4.98 L
FEV6-Pre: 4.96 L
FEV6FVC-%CHANGE-POST: 0 %
FEV6FVC-%PRED-PRE: 104 %
FEV6FVC-%Pred-Post: 104 %
FVC-%CHANGE-POST: 0 %
FVC-%PRED-POST: 107 %
FVC-%Pred-Pre: 106 %
FVC-Post: 5.07 L
FVC-Pre: 5.05 L
POST FEV6/FVC RATIO: 98 %
PRE FEV6/FVC RATIO: 98 %
Post FEV1/FVC ratio: 69 %
Pre FEV1/FVC ratio: 68 %
RV % PRED: 117 %
RV: 3.04 L
TLC % pred: 103 %
TLC: 7.67 L

## 2015-02-09 NOTE — Progress Notes (Signed)
PFT done today. 

## 2015-02-23 ENCOUNTER — Encounter: Payer: Self-pay | Admitting: Pulmonary Disease

## 2015-02-23 ENCOUNTER — Ambulatory Visit (INDEPENDENT_AMBULATORY_CARE_PROVIDER_SITE_OTHER): Payer: Medicare Other | Admitting: Pulmonary Disease

## 2015-02-23 VITALS — BP 150/88 | HR 58 | Ht 73.0 in | Wt 173.6 lb

## 2015-02-23 DIAGNOSIS — J4520 Mild intermittent asthma, uncomplicated: Secondary | ICD-10-CM

## 2015-02-23 NOTE — Progress Notes (Signed)
PFT 02/09/15: FVC 5.05 L (106%) FEV1 3.43 L (99%) FEV1/FVC 0.68 FEF 25-75 1.92 L (73%) no bronchodilator response TLC 7.67 L (103%) RV 117% ERV 96% DLCO uncorrected 75%  LABS 11/05/14 Alpha-1 Antitrypsin:  124 RAST Panel:  Timothy Grass 0.22 / Fescue 0.20 / Rye 0.21 / Red Top 0.22 (all Class 0/I) IgE:  30 Aspergillus Antigen:  <0.10  Phone Conversation: Called the patient and spoke with him because he left from our office before he could be seen.  I apologized.  I relayed the results of his PFTs and serum lab tests. Spirometry appears stable. He reports improving symptoms from his prior telephone calls after restarting Flovent HFA 116mcg 1 inhalation daily. Reports he does have the sensation of "phlegm" in the back of his throat. Denies any dyspnea or wheezing. Denies any sinus congestion or pressure. Denies any reflux or dyspepsia. Advised the patient he can call back to schedule an appointment in the Spring at his convenience. We will [facility] in [facility] [address] [facility] he requested.

## 2015-02-24 ENCOUNTER — Other Ambulatory Visit: Payer: Self-pay | Admitting: *Deleted

## 2015-02-24 ENCOUNTER — Ambulatory Visit: Payer: Self-pay | Admitting: Pulmonary Disease

## 2015-02-24 MED ORDER — FLUTICASONE PROPIONATE HFA 110 MCG/ACT IN AERO: INHALATION_SPRAY | RESPIRATORY_TRACT | Status: DC

## 2015-04-20 DIAGNOSIS — X32XXXD Exposure to sunlight, subsequent encounter: Secondary | ICD-10-CM | POA: Diagnosis not present

## 2015-04-20 DIAGNOSIS — D225 Melanocytic nevi of trunk: Secondary | ICD-10-CM | POA: Diagnosis not present

## 2015-04-20 DIAGNOSIS — L570 Actinic keratosis: Secondary | ICD-10-CM | POA: Diagnosis not present

## 2015-05-11 ENCOUNTER — Telehealth: Payer: Self-pay | Admitting: Pulmonary Disease

## 2015-05-11 MED ORDER — FLUTICASONE PROPIONATE 50 MCG/ACT NA SUSP: 2.0000 | Freq: Every day | NASAL | Status: DC

## 2015-05-11 MED ORDER — FLUTICASONE PROPIONATE HFA 110 MCG/ACT IN AERO: INHALATION_SPRAY | RESPIRATORY_TRACT | Status: DC

## 2015-05-11 NOTE — Telephone Encounter (Signed)
Spoke with pt. He needs refills on Flovent and Flonase. These have been sent in. Nothing further was needed.

## 2015-05-25 ENCOUNTER — Telehealth: Payer: Self-pay | Admitting: Pulmonary Disease

## 2015-05-25 NOTE — Telephone Encounter (Signed)
Spoke with pt. He was informing us he was going to restarted the chlortrimeton 4 mg. He needed nothing further

## 2015-06-09 ENCOUNTER — Telehealth: Payer: Self-pay | Admitting: Diagnostic Neuroimaging

## 2015-06-09 NOTE — Telephone Encounter (Signed)
Wife called to schedule appointment for husband, wife would like to speak to Dr. Leta Baptist away from husband.

## 2015-06-17 ENCOUNTER — Encounter: Payer: Self-pay | Admitting: Diagnostic Neuroimaging

## 2015-06-17 ENCOUNTER — Ambulatory Visit (INDEPENDENT_AMBULATORY_CARE_PROVIDER_SITE_OTHER): Payer: Medicare Other | Admitting: Diagnostic Neuroimaging

## 2015-06-17 VITALS — BP 156/91 | HR 57 | Ht 73.0 in | Wt 167.8 lb

## 2015-06-17 DIAGNOSIS — R413 Other amnesia: Secondary | ICD-10-CM

## 2015-06-17 DIAGNOSIS — F0390 Unspecified dementia without behavioral disturbance: Secondary | ICD-10-CM

## 2015-06-17 DIAGNOSIS — F03A0 Unspecified dementia, mild, without behavioral disturbance, psychotic disturbance, mood disturbance, and anxiety: Secondary | ICD-10-CM

## 2015-06-17 MED ORDER — DONEPEZIL HCL 5 MG PO TABS: 5.0000 mg | ORAL_TABLET | Freq: Every day | ORAL | Status: DC

## 2015-06-17 MED ORDER — DONEPEZIL HCL 10 MG PO TABS: 10.0000 mg | ORAL_TABLET | Freq: Every day | ORAL | Status: DC

## 2015-06-17 NOTE — Progress Notes (Signed)
[facility]  PATIENT: Lucas Murphy DOB: January 28, 1946  REFERRING CLINICIAN: Mady Haagensen HISTORY FROM: patient and wife REASON FOR VISIT: follow up   HISTORICAL  CHIEF COMPLAINT:  Chief Complaint  Patient presents with  . Memory Loss    rm 7, wife- [name], MMSE  19  . Follow-up    HISTORY OF PRESENT ILLNESS:   UPDATE 06/17/15: Since last visit, patient feels some progression. Wife notes more issues with pts driving (swerving, slow turning). Still cares for own ADLs. Wife does finances.   UPDATE 08/27/14: Since last visit, doing about the same per patient, but wife thinks he has declined. He is having diff with word finding, TV comprehension, conversational comprehension, word substitutions. He feels less comfortable in certain social situations. Constantly asking wife the same questions over and over. Difficulty with cleaning the vacuum cleaner.   UPDATE 10/23/13: Since last visit, sxs are stable. Wife here for this visit (teaches experimental psychology at [employer]). She has noted that patient has: short term memory problems, asking her to repeat things, word finding diff, problems with reasoning and problem sovling. Apparently he got lost driving on [address] last year. He has trouble multitasking (talking and driving) and he tends to slow down below speed limit and wander on the road. Not having diff with finances.   PRIOR HPI (07/26/13): 70 year old right-handed male here for evaluation of memory loss and cognitive difficulty. For past 2 years patient has had intermittent episodes of forgetting recent events, having to take more notes, word hesitancy and word finding difficulty. This is noticed by himself and his wife. Patient retired in 2011 and moved to [place]. Since that time he's been very active physically and socially. However he is concerned about his gradually progressive cognitive issues. Patient's mother and father both had dementia. No change in smell or taste. No  vivid dreams. No punching or kicking in sleep. No balance or walking difficulty. No syncope, chest pain or shortness of breath. Otherwise patient is able to maintain all of his activities of daily living. 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   REVIEW OF SYSTEMS: Full 14 system review of systems performed and notable only for speech diff memory loss cough wt loss.  ALLERGIES: No Known Allergies  HOME MEDICATIONS: Outpatient Prescriptions Prior to Visit  Medication Sig Dispense Refill  . aspirin 81 MG tablet Take 81 mg by mouth daily.      . chlorpheniramine (CHLOR-TRIMETON) 4 MG tablet 1 tablet every AM and 1 at bedtime    . fluticasone (FLOVENT HFA) 110 MCG/ACT inhaler 1-2 puffs daily 1 Inhaler 5  . lisinopril (PRINIVIL,ZESTRIL) 10 MG tablet Take 1 tablet by mouth daily.    . fluticasone (FLONASE) 50 MCG/ACT nasal spray Place 2 sprays into both nostrils daily. 16 g 5   No facility-administered medications prior to visit.    PAST MEDICAL HISTORY: Past Medical History  Diagnosis Date  . Other and unspecified hyperlipidemia   . Allergic rhinitis, cause unspecified   . Hypertension   . Asthma     PAST SURGICAL HISTORY: Past Surgical History  Procedure Laterality Date  . Appendectomy    . Hemorroidectomy    . Tonsillectomy    . Colonoscopy      FAMILY HISTORY: Family History  Problem Relation Age of Onset  . Diabetes Father   . Dementia Father   . Dementia Mother   . Lung disease Neg Hx  SOCIAL HISTORY:  Social History   Social History  . Marital Status: Married    Spouse Name: [name]  . Number of Children: 2  . Years of Education: College   Occupational History  . Retired   .     Social History Main Topics  . Smoking status: Former Smoker -- 0.10 packs/day for 25 years    Types: Cigarettes    Quit date: 04/11/1980  . Smokeless tobacco: Never Used  . Alcohol Use: 0.0 oz/week    0 Standard  drinks or equivalent per week     Comment: beer 1-2 times a week, occas wine  . Drug Use: No  . Sexual Activity: Not on file   Other Topics Concern  . Not on file   Social History Narrative   He is originally from [place]. He has also lived in [place]. Remote travel to [place] for his son's wedding. Patient lives at home with wife. Wife is Engineer, water at [employer]. 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 He enjoys golfing. He has cats currently. No bird or mold exposure. He does use the hot tub at his club rarely, mostly in the Fall.     PHYSICAL EXAM  Filed Vitals:   06/17/15 1556  BP: 156/91  Pulse: 57  Height: 6\' 1"  (1.854 m)  Weight: 167 lb 12.8 oz (76.114 kg)    Not recorded      Body mass index is 22.14 kg/(m^2).  MMSE - Mini Mental State Exam 06/17/2015 08/27/2014  Orientation to time 1 5  Orientation to Place 2 5  Registration 3 3  Attention/ Calculation 3 5  Recall 2 0  Language- name 2 objects 2 2  Language- repeat 1 1  Language- follow 3 step command 3 3  Language- read & follow direction 1 1  Write a sentence 1 1  Copy design 0 0  Total score 19 26     GENERAL EXAM: Patient is in no distress; well developed, nourished and groomed; neck is supple  CARDIOVASCULAR: Regular rate and rhythm, no murmurs, no carotid bruits  NEUROLOGIC: MENTAL STATUS: awake, alert, language fluent, comprehension intact, naming intact, fund of knowledge appropriate; NO FRONTAL RELEASE SIGNS. ABNORMAL CLOCK DRAWING. ABNORMAL PENTAGON COPY. 0/3 RECALL CRANIAL NERVE: no papilledema on fundoscopic exam, pupils equal and reactive to light, visual fields full to confrontation, extraocular muscles intact, no nystagmus, facial sensation and strength symmetric, hearing intact, palate elevates symmetrically, uvula midline, shoulder shrug symmetric, tongue midline. MOTOR: normal bulk and tone, full strength in the BUE, BLE SENSORY: normal and symmetric  to light touch, temperature, vibration COORDINATION: finger-nose-finger, fine finger movements normal REFLEXES: deep tendon reflexes present and symmetric GAIT/STATION: narrow based gait; able to walk tandem; romberg is negative    DIAGNOSTIC DATA (LABS, IMAGING, TESTING) - I reviewed patient records, labs, notes, testing and imaging myself where available.  No results found for: WBC No results found for: NA No results found for: CHOL No results found for: HGBA1C Lab Results  Component Value Date   VITAMINB12 738 07/26/2013   No results found for: TSH  08/21/13 MRI BRAIN: 1. 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  2. Mild scattered periventricular and subcortical foci of T2 hyperintensities; especially near the right peri-atrial region. Few punctate chronic cerebral microhemorrhages in the right parietal and temporal regions. These findings are non-specific and considerations include chronic microvascular ischemic disease, autoimmune, inflammatory or post-infectious etiologies.   08/02/13  NEUROTRAX testing:  - Global cognitive score: More than one standard deviation below-average - Memory, verbal function: More than one standard deviation below-average - Executive function, attention, information processing speed, visual spatial: Below-average - Motor skills: Above average   ASSESSMENT AND PLAN  70 y.o. year old male here with subjective memory loss word finding difficulties over past one to 2 years. Neurologic examination is unremarkable except for MMSE 26/30 --> 19/30 and abnormal clock drawing. Prior Neurotrax cognitive testing shows significant deficits in memory and verbal function.  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 donepezil (ARICEPT) 5 MG tablet    Sig: Take 1 tablet (5 mg total) by mouth at bedtime.    Dispense:  30 tablet    Refill:  12  . donepezil (ARICEPT) 10 MG tablet    Sig: Take 1 tablet (10 mg total) by mouth at bedtime.    Dispense:  30 tablet    Refill:  12   Return in about 4 months (around 10/17/2015).    Penni Bombard, MD [id number], [id number] PM Certified in Neurology, Neurophysiology and Neuroimaging  [facility] [address] [place],  [address] [phone]

## 2015-06-17 NOTE — Patient Instructions (Signed)
-   start donepezil 5mg  at bedtime; after 1 month increase to 10mg  at bedtime.

## 2015-07-01 ENCOUNTER — Ambulatory Visit: Payer: Self-pay | Admitting: Diagnostic Neuroimaging

## 2015-07-27 DIAGNOSIS — Z1389 Encounter for screening for other disorder: Secondary | ICD-10-CM | POA: Diagnosis not present

## 2015-07-27 DIAGNOSIS — I10 Essential (primary) hypertension: Secondary | ICD-10-CM | POA: Diagnosis not present

## 2015-07-27 DIAGNOSIS — Z0000 Encounter for general adult medical examination without abnormal findings: Secondary | ICD-10-CM | POA: Diagnosis not present

## 2015-07-27 DIAGNOSIS — Z79899 Other long term (current) drug therapy: Secondary | ICD-10-CM | POA: Diagnosis not present

## 2015-07-27 DIAGNOSIS — E7800 Pure hypercholesterolemia, unspecified: Secondary | ICD-10-CM | POA: Diagnosis not present

## 2015-07-27 DIAGNOSIS — J45909 Unspecified asthma, uncomplicated: Secondary | ICD-10-CM | POA: Diagnosis not present

## 2015-07-27 DIAGNOSIS — Z125 Encounter for screening for malignant neoplasm of prostate: Secondary | ICD-10-CM | POA: Diagnosis not present

## 2015-07-27 DIAGNOSIS — R4789 Other speech disturbances: Secondary | ICD-10-CM | POA: Diagnosis not present

## 2015-07-27 DIAGNOSIS — G3184 Mild cognitive impairment, so stated: Secondary | ICD-10-CM | POA: Diagnosis not present

## 2015-08-24 ENCOUNTER — Telehealth: Payer: Self-pay | Admitting: Pulmonary Disease

## 2015-08-24 MED ORDER — PREDNISONE 10 MG PO TABS: ORAL_TABLET | ORAL | Status: DC

## 2015-08-24 NOTE — Telephone Encounter (Signed)
Spoke with pt and he states that he has had cough with white mucus and nasal congestion for several days. Pt denies wheeze/SOB/CP/tightness or f/n/v. Pt has been using Flonase and Flovent as directed. Pt would like to know what else he can take to help with symptoms.   JN is not in office all week. Routed to BQ.  BQ please advise on recommendations. Thanks!

## 2015-08-24 NOTE — Telephone Encounter (Signed)
Spoke with pt and advised of BQ's recommendations. Pt would like to take pred taper and will get Mucinex at pharmacy. Pt advised to contact us if no improvement or symptoms worsen. Rx sent. Nothing further needed.

## 2015-08-24 NOTE — Telephone Encounter (Signed)
mucinex OTC pred taper: Take 40mg  po daily for 3 days, then take 30mg  po daily for 3 days, then take 20mg  po daily for two days, then take 10mg  po daily for 2 days Call if worse or no improvement

## 2015-09-04 ENCOUNTER — Telehealth: Payer: Self-pay | Admitting: Pulmonary Disease

## 2015-09-04 NOTE — Telephone Encounter (Signed)
Spoke with pt and he states he does feel better. He has been taking Mucinex daily. He does still have a slight productive cough but this has improved. He wanted to make sure that there was nothing else needed after the prednisone taper. Nothing further needed.

## 2015-09-09 ENCOUNTER — Telehealth: Payer: Self-pay | Admitting: Pulmonary Disease

## 2015-09-09 NOTE — Telephone Encounter (Signed)
lmtcb x1 for pt. 

## 2015-09-10 NOTE — Telephone Encounter (Signed)
Spoke with pt and advised that Mucinex is OTC and he can get at his pharmacy. Pt had forgotten it was OTC. Pt states that he does still have some mucus and congestion. Advised pt that if his symptoms do not improve over the next few weeks he can call for appt. Pt voiced understanding. Nothing further needed.

## 2015-09-15 ENCOUNTER — Telehealth: Payer: Self-pay | Admitting: Pulmonary Disease

## 2015-09-15 NOTE — Telephone Encounter (Signed)
Noted  

## 2015-09-17 ENCOUNTER — Telehealth: Payer: Self-pay | Admitting: Pulmonary Disease

## 2015-09-17 NOTE — Telephone Encounter (Signed)
Spoke with the pt  He states that his nose was running and so he started on Allegra  This has helped  I advised okay to continue as needed  JN aware and nothing further needed

## 2015-10-19 ENCOUNTER — Telehealth: Payer: Self-pay | Admitting: Pulmonary Disease

## 2015-10-19 NOTE — Telephone Encounter (Signed)
Called spoke with pt. Reviewed AD's recs. Scheduled him for an ov with JN on 11/09/15 at 9:30am. I offered ov with SG or TP on 11/05/15. He refused both. He voiced understanding and had no further questions. Nothing further needed.

## 2015-10-19 NOTE — Telephone Encounter (Signed)
Called spoke with pt. Pt c/o prod cough with white mucus lasting 2 months. Denies any sinus pressure/drainage, chest congestion/tightness, SOB, wheezing, fever, nausea or vomiting. He states that he has took mucinex a little over a month and a half and has seen no relief. He is requesting a message be sent to the doctor for further recs. [facility] [address]  I explained to him that I would return his call once we received his recs. He voiced understanding and had no further questions.   AD please advise in JN's absence

## 2015-10-19 NOTE — Telephone Encounter (Signed)
   Based on my review, pt was last seen by JN in 2016. Cough is chronic and been going on for 2 mos (recent flare) Does not seem to have an acute issue. I called pt BUT he was out. Spoke to wife.  Pt needs to be seen in the office.  Can we call and schedule a f/u with JN or NP or APPS.  Thanks.   AD

## 2015-10-22 ENCOUNTER — Other Ambulatory Visit: Payer: Self-pay | Admitting: Pulmonary Disease

## 2015-10-23 ENCOUNTER — Ambulatory Visit: Payer: Self-pay | Admitting: Diagnostic Neuroimaging

## 2015-10-26 ENCOUNTER — Telehealth: Payer: Self-pay | Admitting: Pulmonary Disease

## 2015-10-26 NOTE — Telephone Encounter (Signed)
Called spoke with pt. Advised if he still having cough/congestion then okay for the mucinex. Nothing further needed

## 2015-11-09 ENCOUNTER — Ambulatory Visit (INDEPENDENT_AMBULATORY_CARE_PROVIDER_SITE_OTHER): Payer: Medicare Other | Admitting: Pulmonary Disease

## 2015-11-09 ENCOUNTER — Encounter: Payer: Self-pay | Admitting: Pulmonary Disease

## 2015-11-09 ENCOUNTER — Ambulatory Visit: Payer: Self-pay | Admitting: Pulmonary Disease

## 2015-11-09 VITALS — BP 140/80 | HR 50 | Wt 168.4 lb

## 2015-11-09 DIAGNOSIS — J309 Allergic rhinitis, unspecified: Secondary | ICD-10-CM | POA: Diagnosis not present

## 2015-11-09 DIAGNOSIS — R05 Cough: Secondary | ICD-10-CM | POA: Diagnosis not present

## 2015-11-09 DIAGNOSIS — J45909 Unspecified asthma, uncomplicated: Secondary | ICD-10-CM

## 2015-11-09 DIAGNOSIS — J449 Chronic obstructive pulmonary disease, unspecified: Secondary | ICD-10-CM | POA: Insufficient documentation

## 2015-11-09 DIAGNOSIS — R059 Cough, unspecified: Secondary | ICD-10-CM

## 2015-11-09 NOTE — Progress Notes (Signed)
Subjective:    Patient ID: Lucas Murphy, male    DOB: 12-13-1945, 69 y.o.   MRN: [id number]  C.C.:  Follow-up for Asthma w/ Mild Airways Obstruction & Allergic Rhinitis.  HPI Asthma w/ Mild Airways Obstruction:  Cough previously improved after restarting Flovent inhaler. He reports his cough is primarily in the morning. He reports no change in his baseline cough over the Winter. His wife reports he is "clearing his throat". His cough produces a white mucus. Denies any hemoptysis. He continues to work out and Merchandiser, retail. Denies any significant dyspnea. Denies any wheezing. No nocturnal awakenings with coughing. His cough did significantly improve with a Prednisone taper & Mucinex in May 2017.   Allergic Rhinitis:  Previously on Flonase. Denies any significant sinus pressure or congestion. Still has some drainage. He is taking Allegra. He is using Flonase intermittently.   Review of Systems Denies any chest pain or pressure. No fever, chills, or sweats. No reflux, dyspepsia, or morning brash water taste.   No Known Allergies  Current Outpatient Prescriptions on File Prior to Visit  Medication Sig Dispense Refill  . aspirin 81 MG tablet Take 81 mg by mouth daily.      Marland Kitchen donepezil (ARICEPT) 10 MG tablet Take 1 tablet (10 mg total) by mouth at bedtime. 30 tablet 12  . fluticasone (FLONASE) 50 MCG/ACT nasal spray PLACE 2 SPRAYS INTO BOTH NOSTRILS DAILY as needed  5  . fluticasone (FLOVENT HFA) 110 MCG/ACT inhaler 1-2 puffs daily 1 Inhaler 5  . lisinopril (PRINIVIL,ZESTRIL) 10 MG tablet Take 1 tablet by mouth daily.     No current facility-administered medications on file prior to visit.    Past Medical History:  Diagnosis Date  . Allergic rhinitis, cause unspecified   . Asthma   . Hypertension   . Other and unspecified hyperlipidemia    Past Surgical History:  Procedure Laterality Date  . APPENDECTOMY    . COLONOSCOPY    . HEMORROIDECTOMY    . TONSILLECTOMY     Family History    Problem Relation Age of Onset  . Diabetes Father   . Dementia Father   . Dementia Mother   . Lung disease Neg Hx    Social History   Social History  . Marital status: Married    Spouse name: [name]  . Number of children: 2  . Years of education: College   Occupational History  . Retired   .  Retired   Social History Main Topics  . Smoking status: Former Smoker    Packs/day: 0.10    Years: 25.00    Types: Cigarettes    Quit date: 04/11/1980  . Smokeless tobacco: Never Used  . Alcohol use 0.0 oz/week     Comment: beer 1-2 times a week, occas wine  . Drug use: No  . Sexual activity: Not Asked   Other Topics Concern  . None   Social History Narrative   He is originally from [place]. He has also lived in [place]. Remote travel to [place] for his son's wedding. Patient lives at home with wife. Wife is Engineer, water at [employer]. 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 He enjoys golfing. He has cats currently. No bird or mold exposure. He does use the hot tub at his club rarely, mostly in the Fall.       Objective:   Physical Exam BP 140/80 (BP Location: Left Arm, Cuff Size: Normal)  Pulse (!) 50   Wt 168 lb 6.4 oz (76.4 kg)   SpO2 98%   BMI 22.22 kg/m  General:  Awake. Alert. No distress. Wife with patient today. Integument:  Warm & dry. No rash on exposed skin.  Lymphatics:  No appreciated cervical or supraclavicular lymphadenoapthy. HEENT:  Moist mucus membranes. Moderate to severe bilateral nasal turbinate swelling with pale mucosa. No oral ulcers. Cardiovascular:  Regular rate. No edema. Normal S1 & S2. Pulmonary:  Clear bilaterally to auscultation. Normal work of breathing on room air. Speaking in complete sentences.  Abdomen: Soft. Normal bowel sounds. Nondistended. Grossly nontender.  PFT 02/09/15: FVC 5.05 L (106%) FEV1 3.43 L (99%) FEV1/FVC 0.68 FEF 25-75 1.92 L (73%) no bronchodilator response TLC 7.67 L (103%) RV 117% ERV  96% DLCO uncorrected 75% 03/14/11: FVC 5.69 L (116%) FEV1 3.29 L (88%) FEV1/FVC 0.58 FEF 25-75 1.66 (50%)  LABS 11/05/14 Alpha-1 Antitrypsin:  124 RAST Panel:  Timothy Grass 0.22 / Fescue 0.20 / Rye 0.21 / Red Top 0.22 (all Class 0/I) IgE:  30 Aspergillus Antigen:  <0.10    Assessment & Plan:  70 year old Caucasian male with history of asthma and evidence of mild airway obstruction on my review of his spirometry from October 2016. With patient's resolution of cough/significant symptomatic improvement on prednisone and Mucinex I am highly suspicious that his cough and mucus production is due to his underlying asthma with mild airway obstruction. Before escalating inhaled medication therapy at this time I feel that evaluating his allergic rhinitis as well as potential silent laryngo-esophageal reflux is reasonable. He has minimal to no symptoms at this time from these but does have some difficulty recalling his symptoms. I instructed the patient and his wife to contact my office if he had any new breathing problems or questions before his next appointment.   1. Asthma w/ Mild Airways Obstruction: Continuing Flovent HFA 2 inhalations twice daily. Repeat spirometry with bronchodilator challenge at next appointment. Also checking esophagogram. Instructed the patient to try using Mucinex twice daily with plenty of liquids.  2. Allergic Rhinitis: Checking maxillofacial CT scan without contrast. Continuing Allegra and Flonase daily. 3. Health Maintenance: Received Pneumovax October 2010. 4. Follow-up: Patient to return to clinic in 4 weeks or sooner if needed.  Sonia Baller Ashok Cordia, M.D. [facility] Pager:  [phone] After 3pm or if no response, call [phone] 10:09 AM 11/09/15

## 2015-11-09 NOTE — Patient Instructions (Signed)
   You can continue to take Mucinex/Guaifenesin 600mg  twice daily with plenty of liquids to help with your mucus.  Continue using your Flonase and Allegra daily as prescribed.  Continue using your Flovent inhaler 2 inhalations twice daily.  We will review your test results at your next appointment.  Call me if you have any new questions, worsening in your cough or breathing problems before your appointment in 4 weeks.  TESTS ORDERED: 1. Esophagogram 2. Spirometry with bronchodilator challenge at next appointment 3. Maxillofacial/sinus CT limited without contrast

## 2015-11-11 ENCOUNTER — Ambulatory Visit (INDEPENDENT_AMBULATORY_CARE_PROVIDER_SITE_OTHER): Payer: Medicare Other | Admitting: Diagnostic Neuroimaging

## 2015-11-11 ENCOUNTER — Encounter: Payer: Self-pay | Admitting: Diagnostic Neuroimaging

## 2015-11-11 VITALS — BP 153/72 | HR 53 | Wt 169.2 lb

## 2015-11-11 DIAGNOSIS — R413 Other amnesia: Secondary | ICD-10-CM

## 2015-11-11 DIAGNOSIS — G3184 Mild cognitive impairment, so stated: Secondary | ICD-10-CM | POA: Diagnosis not present

## 2015-11-11 MED ORDER — DONEPEZIL HCL 10 MG PO TABS: 10.0000 mg | ORAL_TABLET | Freq: Every day | ORAL | 12 refills | Status: DC

## 2015-11-11 NOTE — Progress Notes (Signed)
[facility]  PATIENT: Lucas Murphy DOB: 1945-07-26  REFERRING CLINICIAN: Mady Haagensen HISTORY FROM: patient and wife REASON FOR VISIT: follow up   HISTORICAL  CHIEF COMPLAINT:  Chief Complaint  Patient presents with  . Memory Loss    rm 7, wifeJeannene Patella, MMSE 25  . Follow-up    HISTORY OF PRESENT ILLNESS:   UPDATE 11/11/15: Since last visit, patient feel well. Wife feels sxs are stable except aphasia slightly worse. Still driving short distances. ADL's stable.   UPDATE 06/17/15: Since last visit, patient feels some progression. Wife notes more issues with pts driving (swerving, slow turning). Still cares for own ADLs. Wife does finances.   UPDATE 08/27/14: Since last visit, doing about the same per patient, but wife thinks he has declined. He is having diff with word finding, TV comprehension, conversational comprehension, word substitutions. He feels less comfortable in certain social situations. Constantly asking wife the same questions over and over. Difficulty with cleaning the vacuum cleaner.   UPDATE 10/23/13: Since last visit, sxs are stable. Wife here for this visit (teaches experimental psychology at [employer]). She has noted that patient has: short term memory problems, asking her to repeat things, word finding diff, problems with reasoning and problem sovling. Apparently he got lost driving on [address] last year. He has trouble multitasking (talking and driving) and he tends to slow down below speed limit and wander on the road. Not having diff with finances.   PRIOR HPI (07/26/13): 70 year old right-handed male here for evaluation of memory loss and cognitive difficulty. For past 2 years patient has had intermittent episodes of forgetting recent events, having to take more notes, word hesitancy and word finding difficulty. This is noticed by himself and his wife. Patient retired in 2011 and moved to [place]. Since that time he's been very active physically and socially.  However he is concerned about his gradually progressive cognitive issues. Patient's mother and father both had dementia. No change in smell or taste. No vivid dreams. No punching or kicking in sleep. No balance or walking difficulty. No syncope, chest pain or shortness of breath. Otherwise patient is able to maintain all of his activities of daily living. 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   REVIEW OF SYSTEMS: Full 14 system review of systems performed and negative except as per HPI.  ALLERGIES: No Known Allergies  HOME MEDICATIONS: Outpatient Medications Prior to Visit  Medication Sig Dispense Refill  . aspirin 81 MG tablet Take 81 mg by mouth daily.      . fexofenadine (ALLEGRA) 180 MG tablet Take 180 mg by mouth daily.    . fluticasone (FLONASE) 50 MCG/ACT nasal spray PLACE 2 SPRAYS INTO BOTH NOSTRILS DAILY as needed  5  . fluticasone (FLOVENT HFA) 110 MCG/ACT inhaler 1-2 puffs daily 1 Inhaler 5  . lisinopril (PRINIVIL,ZESTRIL) 10 MG tablet Take 1 tablet by mouth daily.    Marland Kitchen donepezil (ARICEPT) 10 MG tablet Take 1 tablet (10 mg total) by mouth at bedtime. 30 tablet 12   No facility-administered medications prior to visit.     PAST MEDICAL HISTORY: Past Medical History:  Diagnosis Date  . Allergic rhinitis, cause unspecified   . Asthma   . Hypertension   . Other and unspecified hyperlipidemia     PAST SURGICAL HISTORY: Past Surgical History:  Procedure Laterality Date  . APPENDECTOMY    . COLONOSCOPY    . HEMORROIDECTOMY    .  TONSILLECTOMY      FAMILY HISTORY: Family History  Problem Relation Age of Onset  . Diabetes Father   . Dementia Father   . Dementia Mother   . Lung disease Neg Hx     SOCIAL HISTORY:  Social History   Social History  . Marital status: Married    Spouse name: [name]  . Number of children: 2  . Years of education: College   Occupational History  . Retired   .  Retired     Social History Main Topics  . Smoking status: Former Smoker    Packs/day: 0.10    Years: 25.00    Types: Cigarettes    Quit date: 04/11/1980  . Smokeless tobacco: Never Used  . Alcohol use 0.0 oz/week     Comment: beer 1-2 times a week, occas wine  . Drug use: No  . Sexual activity: Not on file   Other Topics Concern  . Not on file   Social History Narrative   He is originally from [place]. He has also lived in [place]. Remote travel to [place] for his son's wedding. Patient lives at home with wife. Wife is Engineer, water at [employer]. 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 He enjoys golfing. He has cats currently. No bird or mold exposure. He does use the hot tub at his club rarely, mostly in the Fall.     PHYSICAL EXAM  Vitals:   11/11/15 1253  BP: (!) 153/72  Pulse: (!) 53  Weight: 169 lb 3.2 oz (76.7 kg)    Not recorded      Body mass index is 22.32 kg/m.  MMSE - Mini Mental State Exam 11/11/2015 06/17/2015 08/27/2014  Orientation to time 3 1 5   Orientation to Place 5 2 5   Registration 3 3 3   Attention/ Calculation 4 3 5   Recall 2 2 0  Language- name 2 objects 2 2 2   Language- repeat 1 1 1   Language- follow 3 step command 3 3 3   Language- read & follow direction 1 1 1   Write a sentence 1 1 1   Copy design 0 0 0  Total score 25 19 26      GENERAL EXAM: Patient is in no distress; well developed, nourished and groomed; neck is supple  CARDIOVASCULAR: Regular rate and rhythm, no murmurs, no carotid bruits  NEUROLOGIC: MENTAL STATUS: awake, alert, language fluent, comprehension intact, naming intact, fund of knowledge appropriate; NO FRONTAL RELEASE SIGNS. ABNORMAL CLOCK DRAWING. ABNORMAL PENTAGON COPY. 0/3 RECALL CRANIAL NERVE: no papilledema on fundoscopic exam, pupils equal and reactive to light, visual fields full to confrontation, extraocular muscles intact, no nystagmus, facial sensation and strength symmetric, hearing  intact, palate elevates symmetrically, uvula midline, shoulder shrug symmetric, tongue midline. MOTOR: normal bulk and tone, full strength in the BUE, BLE SENSORY: normal and symmetric to light touch, temperature, vibration COORDINATION: finger-nose-finger, fine finger movements normal REFLEXES: deep tendon reflexes present and symmetric GAIT/STATION: narrow based gait; able to walk tandem; romberg is negative    DIAGNOSTIC DATA (LABS, IMAGING, TESTING) - I reviewed patient records, labs, notes, testing and imaging myself where available.  No results found for: WBC No results found for: NA No results found for: CHOL No results found for: HGBA1C Lab Results  Component Value Date   VITAMINB12 738 07/26/2013   No results found for: TSH  08/21/13 MRI BRAIN: 1. 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  2. Mild scattered  periventricular and subcortical foci of T2 hyperintensities; especially near the right peri-atrial region. Few punctate chronic cerebral microhemorrhages in the right parietal and temporal regions. These findings are non-specific and considerations include chronic microvascular ischemic disease, autoimmune, inflammatory or post-infectious etiologies.   08/02/13 NEUROTRAX testing:  - Global cognitive score: More than one standard deviation below-average - Memory, verbal function: More than one standard deviation below-average - Executive function, attention, information processing speed, visual spatial: Below-average - Motor skills: Above average   ASSESSMENT AND PLAN  70 y.o. year old male here with subjective memory loss word finding difficulties over past one to 2 years. Neurologic examination is notable for abnormal MMSE and abnormal clock drawing. Prior Neurotrax cognitive testing shows significant deficits in memory and verbal function. Attempted to enroll in CREAD study in July 2016 but did not meet cutoff for FCSRT.    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 donepezil (ARICEPT) 10 MG tablet    Sig: Take 1 tablet (10 mg total) by mouth at bedtime.    Dispense:  30 tablet    Refill:  12   Return in about 6 months (around 05/13/2016).    Penni Bombard, MD [id number], [id number] PM Certified in Neurology, Neurophysiology and Neuroimaging  [facility] [address] [place],  [address] [phone]

## 2015-11-11 NOTE — Patient Instructions (Addendum)
-   continue current medications  - consider board games, mental stimulation

## 2015-11-13 ENCOUNTER — Ambulatory Visit (INDEPENDENT_AMBULATORY_CARE_PROVIDER_SITE_OTHER)
Admission: RE | Admit: 2015-11-13 | Discharge: 2015-11-13 | Disposition: A | Discharge status: Home / Self Care | Payer: Medicare Other | Source: Ambulatory Visit | Attending: Pulmonary Disease | Admitting: Pulmonary Disease

## 2015-11-13 DIAGNOSIS — R05 Cough: Secondary | ICD-10-CM | POA: Diagnosis not present

## 2015-11-13 DIAGNOSIS — J309 Allergic rhinitis, unspecified: Secondary | ICD-10-CM | POA: Diagnosis not present

## 2015-11-23 ENCOUNTER — Ambulatory Visit (HOSPITAL_COMMUNITY)
Admission: RE | Admit: 2015-11-23 | Discharge: 2015-11-23 | Disposition: A | Discharge status: Home / Self Care | Payer: Medicare Other | Source: Ambulatory Visit | Attending: Pulmonary Disease | Admitting: Pulmonary Disease

## 2015-11-23 DIAGNOSIS — K224 Dyskinesia of esophagus: Secondary | ICD-10-CM | POA: Insufficient documentation

## 2015-11-23 DIAGNOSIS — R059 Cough, unspecified: Secondary | ICD-10-CM

## 2015-11-23 DIAGNOSIS — R05 Cough: Secondary | ICD-10-CM | POA: Insufficient documentation

## 2015-11-23 DIAGNOSIS — K219 Gastro-esophageal reflux disease without esophagitis: Secondary | ICD-10-CM | POA: Diagnosis not present

## 2015-11-27 ENCOUNTER — Telehealth: Payer: Self-pay | Admitting: Pulmonary Disease

## 2015-11-27 MED ORDER — RANITIDINE HCL 150 MG PO TABS: 150.0000 mg | ORAL_TABLET | Freq: Every day | ORAL | 1 refills | Status: DC

## 2015-11-27 NOTE — Telephone Encounter (Signed)
Patient notified of Dr. Ammie Dalton recommendations. Rx sent to pharmacy. Nothing further needed.

## 2015-12-04 DIAGNOSIS — Z23 Encounter for immunization: Secondary | ICD-10-CM | POA: Diagnosis not present

## 2015-12-23 ENCOUNTER — Ambulatory Visit (INDEPENDENT_AMBULATORY_CARE_PROVIDER_SITE_OTHER): Payer: Medicare Other | Admitting: Pulmonary Disease

## 2015-12-23 DIAGNOSIS — J449 Chronic obstructive pulmonary disease, unspecified: Secondary | ICD-10-CM | POA: Diagnosis not present

## 2015-12-23 LAB — PULMONARY FUNCTION TEST
FEF 25-75 Post: 2.81 L/sec
FEF 25-75 Pre: 2.5 L/sec
FEF2575-%CHANGE-POST: 12 %
FEF2575-%PRED-PRE: 98 %
FEF2575-%Pred-Post: 110 %
FEV1-%Change-Post: 5 %
FEV1-%Pred-Post: 107 %
FEV1-%Pred-Pre: 101 %
FEV1-POST: 3.68 L
FEV1-Pre: 3.49 L
FEV1FVC-%CHANGE-POST: 1 %
FEV1FVC-%Pred-Pre: 96 %
FEV6-%CHANGE-POST: 4 %
FEV6-%PRED-POST: 115 %
FEV6-%Pred-Pre: 110 %
FEV6-Post: 5.1 L
FEV6-Pre: 4.88 L
FEV6FVC-%CHANGE-POST: -1 %
FEV6FVC-%Pred-Post: 105 %
FEV6FVC-%Pred-Pre: 106 %
FVC-%Change-Post: 3 %
FVC-%Pred-Post: 110 %
FVC-%Pred-Pre: 106 %
FVC-Post: 5.17 L
FVC-Pre: 4.97 L
POST FEV1/FVC RATIO: 71 %
PRE FEV1/FVC RATIO: 70 %
Post FEV6/FVC ratio: 99 %
Pre FEV6/FVC Ratio: 100 %

## 2015-12-23 NOTE — Progress Notes (Signed)
PFT 12/23/15: FVC 4.97 L (106%) FEV1 3.49 L (101%) FEV1/FVC 0.70 FEF 25-75 2.50 L (90%) negative bronchodilator response  IMAGING BARIUM SWALLOW 11/23/15 (per radiologist):  Moderate volume esophageal reflux. Nonspecific esophageal dysmotility.   MAXILLOFACIAL CT W/O 11/13/15 (per radiologist):  No evidence of sinusitis.

## 2015-12-29 ENCOUNTER — Ambulatory Visit (INDEPENDENT_AMBULATORY_CARE_PROVIDER_SITE_OTHER): Payer: Medicare Other | Admitting: Pulmonary Disease

## 2015-12-29 ENCOUNTER — Encounter: Payer: Self-pay | Admitting: Pulmonary Disease

## 2015-12-29 VITALS — BP 144/82 | HR 52 | Wt 166.6 lb

## 2015-12-29 DIAGNOSIS — J449 Chronic obstructive pulmonary disease, unspecified: Secondary | ICD-10-CM

## 2015-12-29 DIAGNOSIS — K219 Gastro-esophageal reflux disease without esophagitis: Secondary | ICD-10-CM | POA: Diagnosis not present

## 2015-12-29 DIAGNOSIS — J309 Allergic rhinitis, unspecified: Secondary | ICD-10-CM

## 2015-12-29 NOTE — Progress Notes (Signed)
Subjective:    Patient ID: Lucas Murphy, male    DOB: 06-11-1945, 70 y.o.   MRN: [id number]  C.C.:  Follow-up for COPD Mixed Type, Allergic Rhinitis, & GERD.  HPI COPD Mixed Type:  Cough previously improved after restarting Flovent inhaler. He reports his breathing is doing "fine". He is still coughing intermittently but overall this is unchanged. No audible wheezing. He is still clearing his throat.   Allergic Rhinitis:  Previously on Flonase & Allegra but was using Flonase intermittently. Sinus CT scan was predominantly clear. He hasn't been using Allegra lately. Denies any sinus congestion or drainage. His wife does report he is having some clear sinus drainage.   GERD: Seen on barium swallow in August. Patient started empirically on Zantac 150 mg by mouth daily at bedtime via phone. He denies any reflux or dyspepsia. Reports he is compliant with the Zantac.  Review of Systems Denies any chest pain or pressure. No fever, chills, or sweats. No headaches. Does have difficulty with recall with his underlying memory problems.   No Known Allergies  Current Outpatient Prescriptions on File Prior to Visit  Medication Sig Dispense Refill  . aspirin 81 MG tablet Take 81 mg by mouth daily.      Marland Kitchen donepezil (ARICEPT) 10 MG tablet Take 1 tablet (10 mg total) by mouth at bedtime. 30 tablet 12  . fluticasone (FLONASE) 50 MCG/ACT nasal spray PLACE 2 SPRAYS INTO BOTH NOSTRILS DAILY as needed  5  . fluticasone (FLOVENT HFA) 110 MCG/ACT inhaler 1-2 puffs daily 1 Inhaler 5  . lisinopril (PRINIVIL,ZESTRIL) 10 MG tablet Take 1 tablet by mouth daily.    . ranitidine (ZANTAC) 150 MG tablet Take 1 tablet (150 mg total) by mouth at bedtime. 30 tablet 1  . fexofenadine (ALLEGRA) 180 MG tablet Take 180 mg by mouth daily.     No current facility-administered medications on file prior to visit.    Past Medical History:  Diagnosis Date  . Allergic rhinitis, cause unspecified   . Asthma   . Hypertension   .  Other and unspecified hyperlipidemia    Past Surgical History:  Procedure Laterality Date  . APPENDECTOMY    . COLONOSCOPY    . HEMORROIDECTOMY    . TONSILLECTOMY     Family History  Problem Relation Age of Onset  . Diabetes Father   . Dementia Father   . Dementia Mother   . Lung disease Neg Hx    Social History   Social History  . Marital status: Married    Spouse name: [name]  . Number of children: 2  . Years of education: College   Occupational History  . Retired   .  Retired   Social History Main Topics  . Smoking status: Former Smoker    Packs/day: 0.10    Years: 25.00    Types: Cigarettes    Quit date: 04/11/1980  . Smokeless tobacco: Never Used  . Alcohol use 0.0 oz/week     Comment: beer 1-2 times a week, occas wine  . Drug use: No  . Sexual activity: Not Asked   Other Topics Concern  . None   Social History Narrative   He is originally from [place]. He has also lived in [place]. Remote travel to [place] for his son's wedding. Patient lives at home with wife. Wife is Engineer, water at [employer]. 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 He enjoys golfing. He has cats currently.  No bird or mold exposure. He does use the hot tub at his club rarely, mostly in the Fall.       Objective:   Physical Exam BP (!) 144/82 (BP Location: Left Arm, Cuff Size: Normal)   Pulse (!) 52   Wt 166 lb 9.6 oz (75.6 kg)   SpO2 96%   BMI 21.98 kg/m  General:  Awake. Alert. Comfortable. Wife with patient today. Integument:  Warm & dry. No rash on exposed skin.  Lymphatics:  No appreciated cervical or supraclavicular lymphadenoapthy. HEENT:  Moist mucus membranes. Continued prominent and severe bilateral nasal turbinate swelling right greater than left. No oral ulcers. Cardiovascular:  Regular rate & rhythm. No edema. Normal S1 & S2. Pulmonary:  Good aeration bilaterally. No accessory muscle use on room air. Clear on auscultation. Abdomen: Soft.  Normal bowel sounds. Nontender.  PFT 12/23/15: FVC 4.97 L (106%) FEV1 3.49 L (101%) FEV1/FVC 0.70 FEF 25-75 2.50 L (98%) no bronchodilator response 02/09/15: FVC 5.05 L (106%) FEV1 3.43 L (99%) FEV1/FVC 0.68 FEF 25-75 1.92 L (73%) no bronchodilator response TLC 7.67 L (103%) RV 117% ERV 96% DLCO uncorrected 75% 03/14/11: FVC 5.69 L (116%) FEV1 3.29 L (88%) FEV1/FVC 0.58 FEF 25-75 1.66 (50%)  IMAGING BARIUM SWALLOW 11/23/15 (per radiologist): Moderate volume gastroesophageal reflux. Nonspecific esophageal dysmotility.  MAXILLOFACIAL LTD W/O 11/13/15 (per radiologist): Paranasal sinuses unremarkable. No evidence of sinusitis.  LABS 11/05/14 Alpha-1 Antitrypsin:  124 RAST Panel:  Timothy Grass 0.22 / [place]escue 0.20 / Rye 0.21 / Red Top 0.22 (all Class 0/I) IgE:  30 Aspergillus Antigen:  <0.10    Assessment & Plan:  70 y.o. caucasian male with COPD mixed type, Allergic Rhinitis, & GERD. Patient spirometry overall remained stable. It's difficult to determine what the patient's symptoms aren't any given his memory difficulties. Overall he remains asymptomatic with regards to his reflux and has only mild drainage from his allergic rhinitis. I instructed the patient and his wife to contact my office if he had any new breathing problems or questions before his next appointment.   1. COPD Mixed Type: Continuing Flovent HFA 2 inhalations twice daily. Plan to repeat full pulmonary function testing at follow-up appointment. Holding on descalation of inhaled medication until then. 2. Allergic Rhinitis: Continuing Flonase daily. Recommended restarting Allegra given nasal turbinate swelling and mild sinus discharge. 3. GERD: Asymptomatic. Continuing Zantac daily at bedtime. 4. Health Maintenance: S/P Pneumovax October 2010 & Influenza Vaccine September 2017. Recommended Prevnar vaccine in October. 5. Follow-up: Patient to return to clinic in 1 year or sooner if needed.  Sonia Baller Ashok Cordia, M.D. [facility]  Pulmonary & Critical Care Pager:  [phone] After 3pm or if no response, call [phone] 4:25 PM 12/29/15

## 2015-12-29 NOTE — Patient Instructions (Signed)
   Remember to get the Prevnar 13 Vaccine for Pneumonia in October.   Continue taking your medications as prescribed.  Call me if you have any new breathing problems or questions before your next appointment.  I will see you back in 1 year or sooner if needed.  TESTS ORDERED: 1. Full PFTs at follow-up appointment

## 2016-01-26 ENCOUNTER — Other Ambulatory Visit: Payer: Self-pay

## 2016-01-26 MED ORDER — RANITIDINE HCL 150 MG PO TABS
150.0000 mg | ORAL_TABLET | Freq: Every day | ORAL | 3 refills | Status: DC
Start: 2016-01-26 — End: 2016-12-27

## 2016-03-06 ENCOUNTER — Other Ambulatory Visit: Payer: Self-pay | Admitting: Pulmonary Disease

## 2016-04-06 DIAGNOSIS — L308 Other specified dermatitis: Secondary | ICD-10-CM | POA: Diagnosis not present

## 2016-05-18 ENCOUNTER — Encounter: Payer: Self-pay | Admitting: Diagnostic Neuroimaging

## 2016-05-18 ENCOUNTER — Ambulatory Visit (INDEPENDENT_AMBULATORY_CARE_PROVIDER_SITE_OTHER): Payer: Medicare Other | Admitting: Diagnostic Neuroimaging

## 2016-05-18 VITALS — BP 177/85 | HR 52 | Wt 168.4 lb

## 2016-05-18 DIAGNOSIS — F0390 Unspecified dementia without behavioral disturbance: Secondary | ICD-10-CM | POA: Diagnosis not present

## 2016-05-18 DIAGNOSIS — R413 Other amnesia: Secondary | ICD-10-CM | POA: Diagnosis not present

## 2016-05-18 DIAGNOSIS — F03B0 Unspecified dementia, moderate, without behavioral disturbance, psychotic disturbance, mood disturbance, and anxiety: Secondary | ICD-10-CM

## 2016-05-18 MED ORDER — DONEPEZIL HCL 10 MG PO TABS: 10.0000 mg | ORAL_TABLET | Freq: Every day | ORAL | 12 refills | Status: DC

## 2016-05-18 NOTE — Progress Notes (Signed)
[facility]  PATIENT: Lucas Murphy DOB: March 15, 1946  REFERRING CLINICIAN: Mady Haagensen HISTORY FROM: patient and wife REASON FOR VISIT: follow up   HISTORICAL  CHIEF COMPLAINT:  Chief Complaint  Patient presents with  . Memory Loss    rm 6, wifeJeannene Patella, MMSE 17  . Follow-up    6 month    HISTORY OF PRESENT ILLNESS:   UPDATE 05/18/16: Since last visit, patient is progressing. Not driving any more, but patient is upset about this. Staying active. Playing golf.   UPDATE 11/11/15: Since last visit, patient feel well. Wife feels sxs are stable except aphasia slightly worse. Still driving short distances. ADL's stable.   UPDATE 06/17/15: Since last visit, patient feels some progression. Wife notes more issues with pts driving (swerving, slow turning). Still cares for own ADLs. Wife does finances.   UPDATE 08/27/14: Since last visit, doing about the same per patient, but wife thinks he has declined. He is having diff with word finding, TV comprehension, conversational comprehension, word substitutions. He feels less comfortable in certain social situations. Constantly asking wife the same questions over and over. Difficulty with cleaning the vacuum cleaner.   UPDATE 10/23/13: Since last visit, sxs are stable. Wife here for this visit (teaches experimental psychology at [employer]). She has noted that patient has: short term memory problems, asking her to repeat things, word finding diff, problems with reasoning and problem sovling. Apparently he got lost driving on [address] last year. He has trouble multitasking (talking and driving) and he tends to slow down below speed limit and wander on the road. Not having diff with finances.   PRIOR HPI (07/26/13): 71 year old right-handed male here for evaluation of memory loss and cognitive difficulty. For past 2 years patient has had intermittent episodes of forgetting recent events, having to take more notes, word hesitancy and word finding  difficulty. This is noticed by himself and his wife. Patient retired in 2011 and moved to [place]. Since that time he's been very active physically and socially. However he is concerned about his gradually progressive cognitive issues. Patient's mother and father both had dementia. No change in smell or taste. No vivid dreams. No punching or kicking in sleep. No balance or walking difficulty. No syncope, chest pain or shortness of breath. Otherwise patient is able to maintain all of his activities of daily living. 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   REVIEW OF SYSTEMS: Full 14 system review of systems performed and negative except as per HPI.  ALLERGIES: No Known Allergies  HOME MEDICATIONS: Outpatient Medications Prior to Visit  Medication Sig Dispense Refill  . aspirin 81 MG tablet Take 81 mg by mouth daily.      Marland Kitchen donepezil (ARICEPT) 10 MG tablet Take 1 tablet (10 mg total) by mouth at bedtime. 30 tablet 12  . fexofenadine (ALLEGRA) 180 MG tablet Take 180 mg by mouth daily.    Marland Kitchen FLOVENT HFA 110 MCG/ACT inhaler INHALE 1-2 PUFFS DAILY 12 Inhaler 5  . fluticasone (FLONASE) 50 MCG/ACT nasal spray PLACE 2 SPRAYS INTO BOTH NOSTRILS DAILY as needed  5  . lisinopril (PRINIVIL,ZESTRIL) 10 MG tablet Take 1 tablet by mouth daily.    . ranitidine (ZANTAC) 150 MG tablet Take 1 tablet (150 mg total) by mouth at bedtime. 90 tablet 3   No facility-administered medications prior to visit.     PAST MEDICAL HISTORY: Past Medical History:  Diagnosis Date  . Allergic  rhinitis, cause unspecified   . Asthma   . Hypertension   . Other and unspecified hyperlipidemia     PAST SURGICAL HISTORY: Past Surgical History:  Procedure Laterality Date  . APPENDECTOMY    . COLONOSCOPY    . HEMORROIDECTOMY    . TONSILLECTOMY      FAMILY HISTORY: Family History  Problem Relation Age of Onset  . Diabetes Father   . Dementia Father   .  Dementia Mother   . Lung disease Neg Hx     SOCIAL HISTORY:  Social History   Social History  . Marital status: Married    Spouse name: [name]  . Number of children: 2  . Years of education: College   Occupational History  . Retired   .  Retired   Social History Main Topics  . Smoking status: Former Smoker    Packs/day: 0.10    Years: 25.00    Types: Cigarettes    Quit date: 04/11/1980  . Smokeless tobacco: Never Used  . Alcohol use 0.0 oz/week     Comment: beer 1-2 times a week, occas wine  . Drug use: No  . Sexual activity: Not on file   Other Topics Concern  . Not on file   Social History Narrative   He is originally from [place]. He has also lived in [place]. Remote travel to [place] for his son's wedding. Patient lives at home with wife. Wife is Engineer, water at [employer]. 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 He enjoys golfing. He has cats currently. No bird or mold exposure. He does use the hot tub at his club rarely, mostly in the Fall.     PHYSICAL EXAM  Vitals:   05/18/16 1528  BP: (!) 177/85  Pulse: (!) 52  Weight: 168 lb 6.4 oz (76.4 kg)    Not recorded      Body mass index is 22.22 kg/m.  MMSE - Mini Mental State Exam 05/18/2016 11/11/2015 06/17/2015  Orientation to time 2 3 1   Orientation to Place 3 5 2   Registration 3 3 3   Attention/ Calculation 2 4 3   Recall 0 2 2  Language- name 2 objects 2 2 2   Language- repeat 0 1 1  Language- follow 3 step command 3 3 3   Language- read & follow direction 1 1 1   Write a sentence 1 1 1   Copy design 0 0 0  Total score 17 25 19      GENERAL EXAM: Patient is in no distress; well developed, nourished and groomed; neck is supple  CARDIOVASCULAR: Regular rate and rhythm, no murmurs, no carotid bruits  NEUROLOGIC: MENTAL STATUS: awake, alert, language fluent, comprehension intact, naming intact, fund of knowledge appropriate; NO FRONTAL RELEASE SIGNS. ABNORMAL CLOCK  DRAWING. ABNORMAL PENTAGON COPY. 0/3 RECALL CRANIAL NERVE: no papilledema on fundoscopic exam, pupils equal and reactive to light, visual fields full to confrontation, extraocular muscles intact, no nystagmus, facial sensation and strength symmetric, hearing intact, palate elevates symmetrically, uvula midline, shoulder shrug symmetric, tongue midline. MOTOR: normal bulk and tone, full strength in the BUE, BLE SENSORY: normal and symmetric to light touch, temperature, vibration COORDINATION: finger-nose-finger, fine finger movements normal REFLEXES: deep tendon reflexes present and symmetric GAIT/STATION: narrow based gait; able to walk tandem; romberg is negative    DIAGNOSTIC DATA (LABS, IMAGING, TESTING) - I reviewed patient records, labs, notes, testing and imaging myself where available.  No results found for: WBC No results  found for: NA No results found for: CHOL No results found for: HGBA1C Lab Results  Component Value Date   VITAMINB12 738 07/26/2013   No results found for: TSH  08/21/13 MRI BRAIN: 1. 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  2. Mild scattered periventricular and subcortical foci of T2 hyperintensities; especially near the right peri-atrial region. Few punctate chronic cerebral microhemorrhages in the right parietal and temporal regions. These findings are non-specific and considerations include chronic microvascular ischemic disease, autoimmune, inflammatory or post-infectious etiologies.   08/02/13 NEUROTRAX testing:  - Global cognitive score: More than one standard deviation below-average - Memory, verbal function: More than one standard deviation below-average - Executive function, attention, information processing speed, visual spatial: Below-average - Motor skills: Above average   ASSESSMENT AND PLAN  71 y.o. year old male here with subjective memory loss word finding difficulties over past one to 2 years. Neurologic examination is notable  for abnormal MMSE and abnormal clock drawing. Prior Neurotrax cognitive testing shows significant deficits in memory and verbal function. Attempted to enroll in CREAD study in July 2016 but did not meet cutoff for FCSRT.    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 Greater than 50% of time was spent in counseling and coordination of care with patient. In summary we discussed:  - reviewed importance of physical activity, diet (plants, nuts, berries), mental and social stimulation - continue donepezil 10mg  daily - consider memantine in future - no driving - monitor BP at home and stay consistent with medications - consider RAMP or GRADUATE study  Meds ordered this encounter  Medications  . donepezil (ARICEPT) 10 MG tablet    Sig: Take 1 tablet (10 mg total) by mouth at bedtime.    Dispense:  30 tablet    Refill:  12   Return in about 1 year (around 05/18/2017).    Penni Bombard, MD [id number], [id number] PM Certified in Neurology, Neurophysiology and Neuroimaging  [facility] [address], [place], [place] [address] [phone]

## 2016-06-17 DIAGNOSIS — D0461 Carcinoma in situ of skin of right upper limb, including shoulder: Secondary | ICD-10-CM | POA: Diagnosis not present

## 2016-06-24 ENCOUNTER — Telehealth: Payer: Self-pay | Admitting: Pulmonary Disease

## 2016-06-27 NOTE — Telephone Encounter (Signed)
A fax was sent to [facility] at [phone] for flovent hfa 110 mcg inhaler for a 90 day supply with 3 additional refills. The directions states to inhale 1-2 puffs daily. Nothing further is needed.

## 2016-08-01 DIAGNOSIS — Z0000 Encounter for general adult medical examination without abnormal findings: Secondary | ICD-10-CM | POA: Diagnosis not present

## 2016-08-01 DIAGNOSIS — G3184 Mild cognitive impairment, so stated: Secondary | ICD-10-CM | POA: Diagnosis not present

## 2016-08-01 DIAGNOSIS — E7800 Pure hypercholesterolemia, unspecified: Secondary | ICD-10-CM | POA: Diagnosis not present

## 2016-08-01 DIAGNOSIS — Z125 Encounter for screening for malignant neoplasm of prostate: Secondary | ICD-10-CM | POA: Diagnosis not present

## 2016-08-01 DIAGNOSIS — I10 Essential (primary) hypertension: Secondary | ICD-10-CM | POA: Diagnosis not present

## 2016-08-01 DIAGNOSIS — R0989 Other specified symptoms and signs involving the circulatory and respiratory systems: Secondary | ICD-10-CM | POA: Diagnosis not present

## 2016-08-01 DIAGNOSIS — Z1389 Encounter for screening for other disorder: Secondary | ICD-10-CM | POA: Diagnosis not present

## 2016-08-04 ENCOUNTER — Other Ambulatory Visit: Payer: Self-pay | Admitting: Family Medicine

## 2016-08-04 DIAGNOSIS — R0989 Other specified symptoms and signs involving the circulatory and respiratory systems: Secondary | ICD-10-CM

## 2016-08-09 DIAGNOSIS — D0462 Carcinoma in situ of skin of left upper limb, including shoulder: Secondary | ICD-10-CM | POA: Diagnosis not present

## 2016-08-09 DIAGNOSIS — D0461 Carcinoma in situ of skin of right upper limb, including shoulder: Secondary | ICD-10-CM | POA: Diagnosis not present

## 2016-08-09 DIAGNOSIS — L570 Actinic keratosis: Secondary | ICD-10-CM | POA: Diagnosis not present

## 2016-08-09 DIAGNOSIS — D225 Melanocytic nevi of trunk: Secondary | ICD-10-CM | POA: Diagnosis not present

## 2016-08-09 DIAGNOSIS — X32XXXD Exposure to sunlight, subsequent encounter: Secondary | ICD-10-CM | POA: Diagnosis not present

## 2016-08-17 ENCOUNTER — Ambulatory Visit
Admission: RE | Admit: 2016-08-17 | Discharge: 2016-08-17 | Disposition: A | Discharge status: Home / Self Care | Payer: Medicare Other | Source: Ambulatory Visit | Attending: Family Medicine | Admitting: Family Medicine

## 2016-08-17 DIAGNOSIS — R0989 Other specified symptoms and signs involving the circulatory and respiratory systems: Secondary | ICD-10-CM | POA: Diagnosis not present

## 2016-09-20 DIAGNOSIS — Z08 Encounter for follow-up examination after completed treatment for malignant neoplasm: Secondary | ICD-10-CM | POA: Diagnosis not present

## 2016-09-20 DIAGNOSIS — Z85828 Personal history of other malignant neoplasm of skin: Secondary | ICD-10-CM | POA: Diagnosis not present

## 2016-09-20 DIAGNOSIS — L570 Actinic keratosis: Secondary | ICD-10-CM | POA: Diagnosis not present

## 2016-09-20 DIAGNOSIS — X32XXXD Exposure to sunlight, subsequent encounter: Secondary | ICD-10-CM | POA: Diagnosis not present

## 2016-09-23 ENCOUNTER — Telehealth: Payer: Self-pay | Admitting: Diagnostic Neuroimaging

## 2016-09-23 NOTE — Telephone Encounter (Signed)
Patients wife called office in reference to see if patient is able to be prescribed an antidepressant/anti anxiety medication.  Wife is trying to put patient in [facility] during the day and patient has been tearful and anxious while there.  Please call

## 2016-09-23 NOTE — Telephone Encounter (Signed)
Spoke with patient's wife and informed her that Dr Leta Baptist is out of the office until June 25th. Advised she may want to call PCP, Dr Drema Dallas for medication. Wife stated he recently saw Dr Drema Dallas. She stated she would call PCP. Advised she call back for any further needs, questions. She verbalized understanding, appreciation.

## 2016-09-26 DIAGNOSIS — F329 Major depressive disorder, single episode, unspecified: Secondary | ICD-10-CM | POA: Diagnosis not present

## 2016-09-26 DIAGNOSIS — Z111 Encounter for screening for respiratory tuberculosis: Secondary | ICD-10-CM | POA: Diagnosis not present

## 2016-10-06 DIAGNOSIS — M6281 Muscle weakness (generalized): Secondary | ICD-10-CM | POA: Diagnosis not present

## 2016-10-17 DIAGNOSIS — R488 Other symbolic dysfunctions: Secondary | ICD-10-CM | POA: Diagnosis not present

## 2016-10-17 DIAGNOSIS — R41841 Cognitive communication deficit: Secondary | ICD-10-CM | POA: Diagnosis not present

## 2016-10-18 DIAGNOSIS — R41841 Cognitive communication deficit: Secondary | ICD-10-CM | POA: Diagnosis not present

## 2016-10-18 DIAGNOSIS — R488 Other symbolic dysfunctions: Secondary | ICD-10-CM | POA: Diagnosis not present

## 2016-10-19 DIAGNOSIS — R488 Other symbolic dysfunctions: Secondary | ICD-10-CM | POA: Diagnosis not present

## 2016-10-19 DIAGNOSIS — R41841 Cognitive communication deficit: Secondary | ICD-10-CM | POA: Diagnosis not present

## 2016-10-21 DIAGNOSIS — R41841 Cognitive communication deficit: Secondary | ICD-10-CM | POA: Diagnosis not present

## 2016-10-21 DIAGNOSIS — R488 Other symbolic dysfunctions: Secondary | ICD-10-CM | POA: Diagnosis not present

## 2016-10-24 DIAGNOSIS — R488 Other symbolic dysfunctions: Secondary | ICD-10-CM | POA: Diagnosis not present

## 2016-10-24 DIAGNOSIS — R41841 Cognitive communication deficit: Secondary | ICD-10-CM | POA: Diagnosis not present

## 2016-10-26 DIAGNOSIS — R488 Other symbolic dysfunctions: Secondary | ICD-10-CM | POA: Diagnosis not present

## 2016-10-26 DIAGNOSIS — R41841 Cognitive communication deficit: Secondary | ICD-10-CM | POA: Diagnosis not present

## 2016-10-28 DIAGNOSIS — R41841 Cognitive communication deficit: Secondary | ICD-10-CM | POA: Diagnosis not present

## 2016-10-28 DIAGNOSIS — R488 Other symbolic dysfunctions: Secondary | ICD-10-CM | POA: Diagnosis not present

## 2016-10-31 DIAGNOSIS — R488 Other symbolic dysfunctions: Secondary | ICD-10-CM | POA: Diagnosis not present

## 2016-10-31 DIAGNOSIS — R41841 Cognitive communication deficit: Secondary | ICD-10-CM | POA: Diagnosis not present

## 2016-11-01 DIAGNOSIS — R41841 Cognitive communication deficit: Secondary | ICD-10-CM | POA: Diagnosis not present

## 2016-11-01 DIAGNOSIS — R488 Other symbolic dysfunctions: Secondary | ICD-10-CM | POA: Diagnosis not present

## 2016-11-02 DIAGNOSIS — R488 Other symbolic dysfunctions: Secondary | ICD-10-CM | POA: Diagnosis not present

## 2016-11-02 DIAGNOSIS — R41841 Cognitive communication deficit: Secondary | ICD-10-CM | POA: Diagnosis not present

## 2016-11-03 DIAGNOSIS — R488 Other symbolic dysfunctions: Secondary | ICD-10-CM | POA: Diagnosis not present

## 2016-11-03 DIAGNOSIS — R41841 Cognitive communication deficit: Secondary | ICD-10-CM | POA: Diagnosis not present

## 2016-11-04 DIAGNOSIS — R41841 Cognitive communication deficit: Secondary | ICD-10-CM | POA: Diagnosis not present

## 2016-11-04 DIAGNOSIS — R488 Other symbolic dysfunctions: Secondary | ICD-10-CM | POA: Diagnosis not present

## 2016-11-07 DIAGNOSIS — R41841 Cognitive communication deficit: Secondary | ICD-10-CM | POA: Diagnosis not present

## 2016-11-07 DIAGNOSIS — R488 Other symbolic dysfunctions: Secondary | ICD-10-CM | POA: Diagnosis not present

## 2016-11-08 DIAGNOSIS — R488 Other symbolic dysfunctions: Secondary | ICD-10-CM | POA: Diagnosis not present

## 2016-11-08 DIAGNOSIS — R41841 Cognitive communication deficit: Secondary | ICD-10-CM | POA: Diagnosis not present

## 2016-11-09 DIAGNOSIS — R41841 Cognitive communication deficit: Secondary | ICD-10-CM | POA: Diagnosis not present

## 2016-11-10 DIAGNOSIS — R41841 Cognitive communication deficit: Secondary | ICD-10-CM | POA: Diagnosis not present

## 2016-11-11 DIAGNOSIS — R41841 Cognitive communication deficit: Secondary | ICD-10-CM | POA: Diagnosis not present

## 2016-11-14 DIAGNOSIS — R41841 Cognitive communication deficit: Secondary | ICD-10-CM | POA: Diagnosis not present

## 2016-11-16 DIAGNOSIS — R41841 Cognitive communication deficit: Secondary | ICD-10-CM | POA: Diagnosis not present

## 2016-11-18 DIAGNOSIS — R41841 Cognitive communication deficit: Secondary | ICD-10-CM | POA: Diagnosis not present

## 2016-11-21 DIAGNOSIS — R41841 Cognitive communication deficit: Secondary | ICD-10-CM | POA: Diagnosis not present

## 2016-11-23 DIAGNOSIS — R41841 Cognitive communication deficit: Secondary | ICD-10-CM | POA: Diagnosis not present

## 2016-11-25 DIAGNOSIS — R41841 Cognitive communication deficit: Secondary | ICD-10-CM | POA: Diagnosis not present

## 2016-11-28 DIAGNOSIS — R41841 Cognitive communication deficit: Secondary | ICD-10-CM | POA: Diagnosis not present

## 2016-11-30 DIAGNOSIS — R41841 Cognitive communication deficit: Secondary | ICD-10-CM | POA: Diagnosis not present

## 2016-12-02 DIAGNOSIS — R41841 Cognitive communication deficit: Secondary | ICD-10-CM | POA: Diagnosis not present

## 2016-12-05 DIAGNOSIS — R41841 Cognitive communication deficit: Secondary | ICD-10-CM | POA: Diagnosis not present

## 2016-12-07 DIAGNOSIS — R41841 Cognitive communication deficit: Secondary | ICD-10-CM | POA: Diagnosis not present

## 2016-12-14 DIAGNOSIS — R41841 Cognitive communication deficit: Secondary | ICD-10-CM | POA: Diagnosis not present

## 2016-12-16 DIAGNOSIS — R41841 Cognitive communication deficit: Secondary | ICD-10-CM | POA: Diagnosis not present

## 2016-12-20 DIAGNOSIS — R41841 Cognitive communication deficit: Secondary | ICD-10-CM | POA: Diagnosis not present

## 2016-12-27 ENCOUNTER — Other Ambulatory Visit (INDEPENDENT_AMBULATORY_CARE_PROVIDER_SITE_OTHER): Payer: Medicare Other

## 2016-12-27 ENCOUNTER — Ambulatory Visit (INDEPENDENT_AMBULATORY_CARE_PROVIDER_SITE_OTHER): Payer: Medicare Other | Admitting: Pulmonary Disease

## 2016-12-27 ENCOUNTER — Encounter: Payer: Self-pay | Admitting: Pulmonary Disease

## 2016-12-27 VITALS — BP 126/78 | HR 47 | Ht 73.0 in | Wt 169.4 lb

## 2016-12-27 DIAGNOSIS — R001 Bradycardia, unspecified: Secondary | ICD-10-CM | POA: Diagnosis not present

## 2016-12-27 DIAGNOSIS — Z23 Encounter for immunization: Secondary | ICD-10-CM

## 2016-12-27 DIAGNOSIS — K219 Gastro-esophageal reflux disease without esophagitis: Secondary | ICD-10-CM | POA: Diagnosis not present

## 2016-12-27 DIAGNOSIS — J309 Allergic rhinitis, unspecified: Secondary | ICD-10-CM | POA: Diagnosis not present

## 2016-12-27 DIAGNOSIS — J449 Chronic obstructive pulmonary disease, unspecified: Secondary | ICD-10-CM

## 2016-12-27 LAB — COMPREHENSIVE METABOLIC PANEL
ALT: 25 U/L (ref 0–53)
AST: 27 U/L (ref 0–37)
Albumin: 4.2 g/dL (ref 3.5–5.2)
Alkaline Phosphatase: 64 U/L (ref 39–117)
BUN: 18 mg/dL (ref 6–23)
CHLORIDE: 104 meq/L (ref 96–112)
CO2: 31 meq/L (ref 19–32)
CREATININE: 1.07 mg/dL (ref 0.40–1.50)
Calcium: 9.6 mg/dL (ref 8.4–10.5)
GFR: 71.95 mL/min (ref >60.00)
Glucose, Bld: 90 mg/dL (ref 70–99)
POTASSIUM: 4.1 meq/L (ref 3.5–5.1)
Sodium: 139 mEq/L (ref 135–145)
Total Bilirubin: 0.7 mg/dL (ref 0.2–1.2)
Total Protein: 6.6 g/dL (ref 6.0–8.3)

## 2016-12-27 NOTE — Progress Notes (Signed)
Subjective:    Patient ID: Lucas Murphy, male    DOB: 29-Jul-1945, 71 y.o.   MRN: [id number]  C.C.:  Follow-up for COPD Mixed Type, Allergic Rhinitis, & GERD.  HPI He took 4 days worth of medicine on Sunday accidentally including his Zoloft 12.5mg  & Lisinopril 10mg .   COPD mixed type: Continued on Flovent 2 puffs twice daily at last appointment. Has stopped Flovent since last appointment. No new wheezing or breathing problems.   Chronic allergic rhinitis: Previously recommended to restart Allegra. Continued on Flonase. Sinus CT scan predominantly clear on previous imaging. He has stopped his Latty without significant sinus congestion & drainage.   GERD: Previously started on Zantac 150 mg by mouth daily at bedtime. He has stopped Zantac with increased agitation. Hasn't had any worsening in his baseline "clearing of the throat".   Review of Systems Unable to obtain with dementia and memory loss.   No Known Allergies  Current Outpatient Prescriptions on File Prior to Visit  Medication Sig Dispense Refill  . aspirin 81 MG tablet Take 81 mg by mouth daily.      Marland Kitchen donepezil (ARICEPT) 10 MG tablet Take 1 tablet (10 mg total) by mouth at bedtime. 30 tablet 12  . guaiFENesin (MUCINEX) 600 MG 12 hr tablet Take by mouth 2 (two) times daily.    Marland Kitchen lisinopril (PRINIVIL,ZESTRIL) 10 MG tablet Take 1 tablet by mouth daily.     No current facility-administered medications on file prior to visit.    Past Medical History:  Diagnosis Date  . Allergic rhinitis, cause unspecified   . Asthma   . Hypertension   . Other and unspecified hyperlipidemia    Past Surgical History:  Procedure Laterality Date  . APPENDECTOMY    . COLONOSCOPY    . HEMORROIDECTOMY    . TONSILLECTOMY     Family History  Problem Relation Age of Onset  . Diabetes Father   . Dementia Father   . Dementia Mother   . Lung disease Neg Hx    Social History   Social History  . Marital status: Married    Spouse  name: [name]  . Number of children: 2  . Years of education: College   Occupational History  . Retired   .  Retired   Social History Main Topics  . Smoking status: Former Smoker    Packs/day: 0.10    Years: 25.00    Types: Cigarettes    Quit date: 04/11/1980  . Smokeless tobacco: Never Used  . Alcohol use 0.0 oz/week     Comment: beer 1-2 times a week, occas wine  . Drug use: No  . Sexual activity: Not Asked   Other Topics Concern  . None   Social History Narrative   He is originally from [place]. He has also lived in [place]. Remote travel to [place] for his son's wedding. Patient lives at home with wife. Wife is Engineer, water at [employer]. 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 He enjoys golfing. He has cats currently. No bird or mold exposure. He does use the hot tub at his club rarely, mostly in the Fall.       Objective:   Physical Exam BP 126/78 (BP Location: Left Arm, Cuff Size: Normal)   Pulse (!) 47   Ht 6\' 1"  (1.854 m)   Wt 169 lb 6.4 oz (76.8 kg)   SpO2 99%   BMI 22.35 kg/m  General:  Awake. No distress. Alert. Integument:  Warm & dry. No rash on exposed skin. No bruising on exposed skin. Extremities:  No cyanosis or clubbing.  HEENT:  Moist mucus membranes. No scleral icterus. No oral ulcers. Cardiovascular:  Bradycardic. Regular rhythm. No edema.  Pulmonary:  Clear to auscultation bilaterally. Symmetric chest wall expansion. Normal work of breathing. Abdomen: Soft. Normal bowel sounds. Nondistended.  Musculoskeletal:  Normal bulk and tone. No joint deformity or effusion appreciated.  PFT 12/23/15: FVC 4.97 L (106%) FEV1 3.49 L (101%) FEV1/FVC 0.70 FEF 25-75 2.50 L (98%) no bronchodilator response 02/09/15: FVC 5.05 L (106%) FEV1 3.43 L (99%) FEV1/FVC 0.68 FEF 25-75 1.92 L (73%) no bronchodilator response TLC 7.67 L (103%) RV 117% ERV 96% DLCO uncorrected 75% 03/14/11: FVC 5.69 L (116%) FEV1 3.29 L (88%) FEV1/FVC 0.58 FEF  25-75 1.66 (50%)  IMAGING BARIUM SWALLOW 11/23/15 (per radiologist): Moderate volume gastroesophageal reflux. Nonspecific esophageal dysmotility.  MAXILLOFACIAL LTD W/O 11/13/15 (per radiologist): Paranasal sinuses unremarkable. No evidence of sinusitis.  CARDIAC EKG 12/27/16 (personally reviewed by me):  Sinus bradycardia with QT 498 ms. Normal QRS duration.   LABS 11/05/14 Alpha-1 Antitrypsin:  124 RAST Panel:  Timothy Grass 0.22 / [place] 0.20 / R[place] 0.21 / Red Top 0.22 (all Class 0/I) IgE:  30 Aspergillus Antigen:  <0.10    Assessment & Plan:  71 y.o. male with history of mixed type COPD, chronic allergic rhinitis, and GERD. Patient has minimal to no symptoms from these problems despite discontinuing the majority of his regimen. He did accidentally take quadruple his dose of Zoloft and lisinopril. This amounted to 50 mg of Zoloft and 40 mg of lisinopril. The patient does have mild prolongation of his QT interval but no other evidence of a rhythm abnormality on his EKG today. He has evidence of sinus bradycardia on his EKG but I have no prior EKG to refer to. However, his previous heart rate was bradycardic at his last visit as well. I urged the patient and his wife to notify his primary care physician of this medication mishap to provide further follow-up and guidance regarding dosing and possible need for increasing his lisinopril for blood pressure control. Given his possibly of symptoms from his COPD, allergies, and GERD I feel that continued abstinence from his previous medication regimen is reasonable. I instructed his wife to contact our office if he had any new breathing problems or questions as we would be happy to see him back. Otherwise, the patient will continue to follow with his primary care physician for his other medical needs.  1. Accidental excess medication ingestion:  Checking CMP today. Patient/wife to notify his PCP for further instructions. 2. Sinus bradycardia/prolonged QT:  Defer to primary care physician in further management. 3. COPD mixed type:  Continuing to hold on inhaler medications.  4. Chronic allergic rhinitis:  Continuing to hold on medications.  5. GERD:  Continuing to hold on medications. 6. Health maintenance: Status post Pneumovax October 2010. Administering Prevnar vaccine today. Recommended Flu vaccine in October.  7. Follow-up: Return to clinic as needed.   Sonia Baller Ashok Cordia, M.D. [facility] Pager:  [phone] After 3pm or if no response, call [phone] 5:08 PM 12/27/16

## 2016-12-27 NOTE — Patient Instructions (Addendum)
   Remember to get your Flu Vaccine in October.  Remember to call Dr. Drema Dallas' office for an appointment to address your blood pressure and possibly increasing your Lisinopril. Also let them know about the medication mishap.   Continue to hold on your medications as you have been.  If you have any new breathing problems, coughing, or wheezing please call and we will be happy to see you back. Otherwise you can continue to follow-up with your primary care physician.  TESTS TODAY: 1. Serum CMP today

## 2016-12-28 ENCOUNTER — Telehealth: Payer: Self-pay | Admitting: Pulmonary Disease

## 2016-12-28 DIAGNOSIS — R41841 Cognitive communication deficit: Secondary | ICD-10-CM | POA: Diagnosis not present

## 2016-12-28 NOTE — Telephone Encounter (Signed)
[name] spouse [name] returned call Advised of lab results as stated by JN and that results will be faxed to pt's PCP [name] voiced her understanding Results routed to PCP via biscom Nothing further needed, will sign off

## 2016-12-28 NOTE — Telephone Encounter (Signed)
Notes recorded by Javier Glazier, MD on 12/27/2016 at 5:38 PM EDT Please ensure this lab work is faxed to his primary care physician. Also contact his wife and let her know that his lab tests were normal. Thank you. ------ lmtcb for pt's spouse.

## 2016-12-30 DIAGNOSIS — R41841 Cognitive communication deficit: Secondary | ICD-10-CM | POA: Diagnosis not present

## 2017-01-01 DIAGNOSIS — Z23 Encounter for immunization: Secondary | ICD-10-CM | POA: Diagnosis not present

## 2017-01-02 ENCOUNTER — Telehealth: Payer: Self-pay | Admitting: Pulmonary Disease

## 2017-01-02 DIAGNOSIS — R41841 Cognitive communication deficit: Secondary | ICD-10-CM | POA: Diagnosis not present

## 2017-01-02 DIAGNOSIS — R197 Diarrhea, unspecified: Secondary | ICD-10-CM | POA: Diagnosis not present

## 2017-01-02 DIAGNOSIS — I10 Essential (primary) hypertension: Secondary | ICD-10-CM | POA: Diagnosis not present

## 2017-01-02 DIAGNOSIS — G3184 Mild cognitive impairment, so stated: Secondary | ICD-10-CM | POA: Diagnosis not present

## 2017-01-02 DIAGNOSIS — F329 Major depressive disorder, single episode, unspecified: Secondary | ICD-10-CM | POA: Diagnosis not present

## 2017-01-02 DIAGNOSIS — R4789 Other speech disturbances: Secondary | ICD-10-CM | POA: Diagnosis not present

## 2017-01-02 NOTE — Telephone Encounter (Signed)
Spoke with pt's wife and would like a copy of his labs and EKG sent to Dr. Drema Dallas at [place]. Will fax today because pt has an appt today at 3:15pm. Nothing further is needed.   Fax: [phone]

## 2017-01-09 DIAGNOSIS — R41841 Cognitive communication deficit: Secondary | ICD-10-CM | POA: Diagnosis not present

## 2017-01-12 DIAGNOSIS — H2513 Age-related nuclear cataract, bilateral: Secondary | ICD-10-CM | POA: Diagnosis not present

## 2017-01-16 DIAGNOSIS — R41841 Cognitive communication deficit: Secondary | ICD-10-CM | POA: Diagnosis not present

## 2017-02-14 DIAGNOSIS — H43813 Vitreous degeneration, bilateral: Secondary | ICD-10-CM | POA: Diagnosis not present

## 2017-02-14 DIAGNOSIS — H2513 Age-related nuclear cataract, bilateral: Secondary | ICD-10-CM | POA: Diagnosis not present

## 2017-02-21 DIAGNOSIS — H2513 Age-related nuclear cataract, bilateral: Secondary | ICD-10-CM | POA: Diagnosis not present

## 2017-03-23 DIAGNOSIS — H2512 Age-related nuclear cataract, left eye: Secondary | ICD-10-CM | POA: Diagnosis not present

## 2017-05-22 ENCOUNTER — Encounter: Payer: Self-pay | Admitting: Diagnostic Neuroimaging

## 2017-05-22 ENCOUNTER — Encounter: Payer: Self-pay | Admitting: *Deleted

## 2017-05-22 ENCOUNTER — Encounter (INDEPENDENT_AMBULATORY_CARE_PROVIDER_SITE_OTHER): Payer: Self-pay

## 2017-05-22 ENCOUNTER — Ambulatory Visit (INDEPENDENT_AMBULATORY_CARE_PROVIDER_SITE_OTHER): Payer: Medicare Other | Admitting: Diagnostic Neuroimaging

## 2017-05-22 VITALS — BP 172/83 | HR 49 | Wt 168.8 lb

## 2017-05-22 DIAGNOSIS — F03B0 Unspecified dementia, moderate, without behavioral disturbance, psychotic disturbance, mood disturbance, and anxiety: Secondary | ICD-10-CM

## 2017-05-22 DIAGNOSIS — F0390 Unspecified dementia without behavioral disturbance: Secondary | ICD-10-CM

## 2017-05-22 MED ORDER — DONEPEZIL HCL 10 MG PO TABS: 10.0000 mg | ORAL_TABLET | Freq: Every day | ORAL | 12 refills | Status: AC

## 2017-05-22 MED ORDER — MEMANTINE HCL 10 MG PO TABS: 10.0000 mg | ORAL_TABLET | Freq: Two times a day (BID) | ORAL | 12 refills | Status: DC
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
## 2017-05-24 DIAGNOSIS — R41841 Cognitive communication deficit: Secondary | ICD-10-CM | POA: Diagnosis not present

## 2017-05-30 DIAGNOSIS — R41841 Cognitive communication deficit: Secondary | ICD-10-CM | POA: Diagnosis not present

## 2017-05-31 DIAGNOSIS — R41841 Cognitive communication deficit: Secondary | ICD-10-CM | POA: Diagnosis not present

## 2017-06-02 DIAGNOSIS — R41841 Cognitive communication deficit: Secondary | ICD-10-CM | POA: Diagnosis not present

## 2017-06-05 DIAGNOSIS — R41841 Cognitive communication deficit: Secondary | ICD-10-CM | POA: Diagnosis not present

## 2017-06-07 DIAGNOSIS — R41841 Cognitive communication deficit: Secondary | ICD-10-CM | POA: Diagnosis not present

## 2017-06-08 DIAGNOSIS — R41841 Cognitive communication deficit: Secondary | ICD-10-CM | POA: Diagnosis not present

## 2017-06-12 DIAGNOSIS — R41841 Cognitive communication deficit: Secondary | ICD-10-CM | POA: Diagnosis not present

## 2017-06-13 DIAGNOSIS — R41841 Cognitive communication deficit: Secondary | ICD-10-CM | POA: Diagnosis not present

## 2017-06-14 DIAGNOSIS — R41841 Cognitive communication deficit: Secondary | ICD-10-CM | POA: Diagnosis not present

## 2017-06-19 DIAGNOSIS — R41841 Cognitive communication deficit: Secondary | ICD-10-CM | POA: Diagnosis not present

## 2017-06-21 DIAGNOSIS — R41841 Cognitive communication deficit: Secondary | ICD-10-CM | POA: Diagnosis not present

## 2017-06-22 DIAGNOSIS — R41841 Cognitive communication deficit: Secondary | ICD-10-CM | POA: Diagnosis not present

## 2017-06-26 DIAGNOSIS — R41841 Cognitive communication deficit: Secondary | ICD-10-CM | POA: Diagnosis not present

## 2017-06-28 DIAGNOSIS — R41841 Cognitive communication deficit: Secondary | ICD-10-CM | POA: Diagnosis not present

## 2017-07-05 DIAGNOSIS — R41841 Cognitive communication deficit: Secondary | ICD-10-CM | POA: Diagnosis not present

## 2017-07-07 DIAGNOSIS — R41841 Cognitive communication deficit: Secondary | ICD-10-CM | POA: Diagnosis not present

## 2017-07-10 DIAGNOSIS — R41841 Cognitive communication deficit: Secondary | ICD-10-CM | POA: Diagnosis not present

## 2017-07-17 DIAGNOSIS — R41841 Cognitive communication deficit: Secondary | ICD-10-CM | POA: Diagnosis not present

## 2017-07-24 ENCOUNTER — Telehealth: Payer: Self-pay | Admitting: *Deleted

## 2017-07-24 MED ORDER — MEMANTINE HCL 10 MG PO TABS: 10.0000 mg | ORAL_TABLET | Freq: Two times a day (BID) | ORAL | 3 refills | Status: DC

## 2017-07-24 NOTE — Telephone Encounter (Signed)
Received fax from pharmacy, patient requested 90 day supply Memantine, [facility]. Prescription from Feb changed to reflect 90 day supply, 3 refills.

## 2017-08-08 DIAGNOSIS — Z0000 Encounter for general adult medical examination without abnormal findings: Secondary | ICD-10-CM | POA: Diagnosis not present

## 2017-08-08 DIAGNOSIS — R4789 Other speech disturbances: Secondary | ICD-10-CM | POA: Diagnosis not present

## 2017-08-08 DIAGNOSIS — Z23 Encounter for immunization: Secondary | ICD-10-CM | POA: Diagnosis not present

## 2017-08-08 DIAGNOSIS — F329 Major depressive disorder, single episode, unspecified: Secondary | ICD-10-CM | POA: Diagnosis not present

## 2017-08-08 DIAGNOSIS — I10 Essential (primary) hypertension: Secondary | ICD-10-CM | POA: Diagnosis not present

## 2017-08-08 DIAGNOSIS — E7800 Pure hypercholesterolemia, unspecified: Secondary | ICD-10-CM | POA: Diagnosis not present

## 2017-08-08 DIAGNOSIS — Z125 Encounter for screening for malignant neoplasm of prostate: Secondary | ICD-10-CM | POA: Diagnosis not present

## 2017-08-08 DIAGNOSIS — J45909 Unspecified asthma, uncomplicated: Secondary | ICD-10-CM | POA: Diagnosis not present

## 2017-08-08 DIAGNOSIS — Z1159 Encounter for screening for other viral diseases: Secondary | ICD-10-CM | POA: Diagnosis not present

## 2017-08-08 DIAGNOSIS — G3184 Mild cognitive impairment, so stated: Secondary | ICD-10-CM | POA: Diagnosis not present

## 2017-08-08 DIAGNOSIS — Z1389 Encounter for screening for other disorder: Secondary | ICD-10-CM | POA: Diagnosis not present

## 2017-09-22 DIAGNOSIS — F0390 Unspecified dementia without behavioral disturbance: Secondary | ICD-10-CM | POA: Diagnosis not present

## 2017-09-22 DIAGNOSIS — Z111 Encounter for screening for respiratory tuberculosis: Secondary | ICD-10-CM | POA: Diagnosis not present

## 2017-09-22 DIAGNOSIS — R05 Cough: Secondary | ICD-10-CM | POA: Diagnosis not present

## 2017-11-08 DIAGNOSIS — C44212 Basal cell carcinoma of skin of right ear and external auricular canal: Secondary | ICD-10-CM | POA: Diagnosis not present

## 2017-11-08 DIAGNOSIS — C44319 Basal cell carcinoma of skin of other parts of face: Secondary | ICD-10-CM | POA: Diagnosis not present

## 2017-11-08 DIAGNOSIS — X32XXXD Exposure to sunlight, subsequent encounter: Secondary | ICD-10-CM | POA: Diagnosis not present

## 2017-11-08 DIAGNOSIS — L820 Inflamed seborrheic keratosis: Secondary | ICD-10-CM | POA: Diagnosis not present

## 2017-11-08 DIAGNOSIS — D225 Melanocytic nevi of trunk: Secondary | ICD-10-CM | POA: Diagnosis not present

## 2017-11-08 DIAGNOSIS — L570 Actinic keratosis: Secondary | ICD-10-CM | POA: Diagnosis not present

## 2017-12-08 DIAGNOSIS — Z08 Encounter for follow-up examination after completed treatment for malignant neoplasm: Secondary | ICD-10-CM | POA: Diagnosis not present

## 2017-12-08 DIAGNOSIS — Z85828 Personal history of other malignant neoplasm of skin: Secondary | ICD-10-CM | POA: Diagnosis not present

## 2017-12-08 DIAGNOSIS — L820 Inflamed seborrheic keratosis: Secondary | ICD-10-CM | POA: Diagnosis not present

## 2018-03-06 ENCOUNTER — Telehealth: Payer: Self-pay | Admitting: Diagnostic Neuroimaging

## 2018-03-06 DIAGNOSIS — Z23 Encounter for immunization: Secondary | ICD-10-CM | POA: Diagnosis not present

## 2018-03-06 NOTE — Telephone Encounter (Signed)
Pt wife-Murphy,Lucas is asking for a call to discuss pt being taken off of the donepezil (ARICEPT) 10 MG tablet and memantine (NAMENDA) 10 MG tablet
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
## 2018-03-07 NOTE — Addendum Note (Signed)
Addended by: Noberto Retort C on: 03/07/2018 11:47 AM   Modules accepted: Orders

## 2018-03-07 NOTE — Telephone Encounter (Signed)
I called [facility] at [phone].  I put in order to discontinue the Namenda.  The patient apparently has had increased agitation after starting the medication in April or March 2019.  The patient is also on Aricept, he has been on this for much longer.  Increased agitation likely is a result of the worsening dementia.  The family however wishes to have this medication stopped.  We may stop the Aricept as well in the future.  I will fax the order to [facility] greens at [phone].

## 2018-03-19 MED ORDER — SERTRALINE HCL 100 MG PO TABS: 100.0000 mg | ORAL_TABLET | Freq: Every day | ORAL | 1 refills | Status: AC

## 2018-03-19 NOTE — Addendum Note (Signed)
Addended by: Kathrynn Ducking on: 03/19/2018 05:24 PM   Modules accepted: Orders
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
## 2018-03-19 NOTE — Telephone Encounter (Signed)
I called the wife.  The patient still had ongoing aggression, we will go up on the Zoloft taking 100 mg daily, I will fax in a prescription to [phone].  The patient resides at [facility].

## 2018-03-26 ENCOUNTER — Telehealth: Payer: Self-pay
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
## 2018-03-27 ENCOUNTER — Encounter: Payer: Self-pay | Admitting: Adult Health

## 2018-03-27 ENCOUNTER — Ambulatory Visit (INDEPENDENT_AMBULATORY_CARE_PROVIDER_SITE_OTHER): Payer: Medicare Other | Admitting: Adult Health

## 2018-03-27 VITALS — BP 116/67 | HR 65 | Ht 73.0 in | Wt 170.8 lb

## 2018-03-27 DIAGNOSIS — R4689 Other symptoms and signs involving appearance and behavior: Secondary | ICD-10-CM

## 2018-03-27 DIAGNOSIS — F0391 Unspecified dementia with behavioral disturbance: Secondary | ICD-10-CM

## 2018-03-27 MED ORDER — RISPERIDONE 1 MG PO TABS: 1.0000 mg | ORAL_TABLET | Freq: Every day | ORAL | 5 refills | Status: AC
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
## 2018-03-27 NOTE — Patient Instructions (Addendum)
Your Plan:  Continue Zoloft and Aricept  Start Risperdal 1 mg at bedtime Blood work and UA today If your symptoms worsen or you develop new symptoms please let us know.    Thank you for coming to see [place] at [facility]. I hope we have been able to provide you high quality care today.  You may receive a patient satisfaction survey over the next few weeks. We would appreciate your feedback and comments so that we may continue to improve ourselves and the health of our patients.  Risperidone tablets What is this medicine? RISPERIDONE (ris PER i done) is an antipsychotic. It is used to treat schizophrenia, bipolar disorder, and some symptoms of autism. This medicine may be used for other purposes; ask your health care provider or pharmacist if you have questions. COMMON BRAND NAME(S): Risperdal What should I tell my health care provider before I take this medicine? They need to know if you have any of these conditions: -blood disorder or disease -dementia -diabetes or a family history of diabetes -difficulty swallowing -heart disease or previous heart attack -history of brain tumor or head injury -history of breast cancer -irregular heartbeat or low blood pressure -kidney or liver disease -Parkinson's disease -seizures (convulsions) -an unusual or allergic reaction to risperidone, paliperidone, other medicines, foods, dyes, or preservatives -pregnant or trying to get pregnant -breast-feeding How should I use this medicine? Take this medicine by mouth with a glass of water. Follow the directions on the prescription label. You can take it with or without food. If it upsets your stomach, take it with food. Take your medicine at regular intervals. Do not take it more often than directed. Do not stop taking except on your doctor's advice. Talk to your pediatrician regarding the use of this medicine in children. While this drug may be prescribed for children as young as 34  years of age for selected conditions, precautions do apply. Overdosage: If you think you have taken too much of this medicine contact a poison control center or emergency room at once. NOTE: This medicine is only for you. Do not share this medicine with others. What if I miss a dose? If you miss a dose, take it as soon as you can. If it is almost time for your next dose, take only that dose. Do not take double or extra doses. What may interact with this medicine? Do not take this medicine with any of the following medications: -certain medicines for fungal infections like fluconazole, itraconazole, ketoconazole, posaconazole, voriconazole -cisapride -dofetilide -dronedarone -droperidol -pimozide -sparfloxacin -thioridazine This medicine may also interact with the following medications: -arsenic trioxide -certain antibiotics like clarithromycin, gatifloxacin, levofloxacin, moxifloxacin, pentamidine, rifampin -certain medicines for blood pressure -certain medicines for cancer -certain medicines for irregular heart beat -certain medications for Parkinson's disease like levodopa -certain medicines for seizures like carbamazepine -certain medicines for sleep or sedation -narcotic medicines for pain -other medicines for mental anxiety, depression, or psychotic disturbances -other medicines that prolong the QT interval (cause an abnormal heart rhythm) -ritonavir This list may not describe all possible interactions. Give your health care provider a list of all the medicines, herbs, non-prescription drugs, or dietary supplements you use. Also tell them if you smoke, drink alcohol, or use illegal drugs. Some items may interact with your medicine. What should I watch for while using this medicine? Visit your doctor or health care professional for regular checks on your progress. It may be several weeks before you see the full effects. Do  not suddenly stop taking this medicine. You may need to  gradually reduce the dose. Only stop taking this medicine on the advice of your doctor or health care professional. Dennis Bast may get dizzy or drowsy. Do not drive, use machinery, or do anything that needs mental alertness until you know how this medicine affects you. Do not stand or sit up quickly, especially if you are an older patient. This reduces the risk of dizzy or fainting spells. Alcohol can increase dizziness and drowsiness. Avoid alcoholic drinks. You can get a hangover effect the morning after a bedtime dose. Do not treat yourself for colds, diarrhea or allergies. Ask your doctor or health care professional for advice, some nonprescription medicines may increase possible side effects. This medicine can reduce the response of your body to heat or cold. Dress warm in cold weather and stay hydrated in hot weather. If possible, avoid extreme temperatures like saunas, hot tubs, very hot or cold showers, or activities that can cause dehydration such as vigorous exercise. What side effects may I notice from receiving this medicine? Side effects that you should report to your doctor or health care professional as soon as possible: -aching muscles and joints -confusion -fainting spells -fast or irregular heartbeat -fever or chills, sore throat -increased hunger or thirst -increased urination -loss of balance, difficulty walking or falls -stiffness, spasms, trembling -uncontrollable head, mouth, neck, arm, or leg movements -unusually weak or tired Side effects that usually do not require medical attention (report to your doctor or health care professional if they continue or are bothersome): -constipation -decreased sexual ability -difficulty sleeping -drowsiness or dizziness -increase or decrease in saliva -nausea, vomiting -weight gain This list may not describe all possible side effects. Call your doctor for medical advice about side effects. You may report side effects to FDA at  1-800-FDA-1088. Where should I keep my medicine? Keep out of the reach of children. Store at room temperature between 15 and 25 degrees C (59 and 77 degrees F). Protect from light. Throw away any unused medicine after the expiration date. NOTE: This sheet is a summary. It may not cover all possible information. If you have questions about this medicine, talk to your doctor, pharmacist, or health care provider.  2018 Elsevier/Gold Standard (2015-11-16 18:50:25)

## 2018-03-28 ENCOUNTER — Telehealth: Payer: Self-pay | Admitting: *Deleted

## 2018-03-28 ENCOUNTER — Telehealth: Payer: Self-pay | Admitting: Adult Health

## 2018-03-28 LAB — COMPREHENSIVE METABOLIC PANEL
ALK PHOS: 64 IU/L (ref 39–117)
ALT: 16 IU/L (ref 0–44)
AST: 21 IU/L (ref 0–40)
Albumin/Globulin Ratio: 2.1 (ref 1.2–2.2)
Albumin: 4.2 g/dL (ref 3.5–4.8)
BUN/Creatinine Ratio: 13 (ref 10–24)
BUN: 15 mg/dL (ref 8–27)
Bilirubin Total: 0.9 mg/dL (ref 0.0–1.2)
CHLORIDE: 105 mmol/L (ref 96–106)
CO2: 24 mmol/L (ref 20–29)
Calcium: 9.8 mg/dL (ref 8.6–10.2)
Creatinine, Ser: 1.17 mg/dL (ref 0.76–1.27)
GFR calc Af Amer: 71 mL/min/{1.73_m2} (ref >59)
GFR calc non Af Amer: 61 mL/min/{1.73_m2} (ref >59)
GLUCOSE: 69 mg/dL (ref 65–99)
Globulin, Total: 2 g/dL (ref 1.5–4.5)
Potassium: 4 mmol/L (ref 3.5–5.2)
Sodium: 141 mmol/L (ref 134–144)
Total Protein: 6.2 g/dL (ref 6.0–8.5)

## 2018-03-28 LAB — CBC WITH DIFFERENTIAL/PLATELET
BASOS ABS: 0 10*3/uL (ref 0.0–0.2)
Basos: 1 %
EOS (ABSOLUTE): 0.2 10*3/uL (ref 0.0–0.4)
EOS: 3 %
HEMATOCRIT: 47.3 % (ref 37.5–51.0)
Hemoglobin: 15.4 g/dL (ref 13.0–17.7)
IMMATURE GRANULOCYTES: 0 %
Immature Grans (Abs): 0 10*3/uL (ref 0.0–0.1)
Lymphocytes Absolute: 1.5 10*3/uL (ref 0.7–3.1)
Lymphs: 22 %
MCH: 29.9 pg (ref 26.6–33.0)
MCHC: 32.6 g/dL (ref 31.5–35.7)
MCV: 92 fL (ref 79–97)
MONOCYTES: 7 %
MONOS ABS: 0.5 10*3/uL (ref 0.1–0.9)
NEUTROS PCT: 67 %
Neutrophils Absolute: 4.5 10*3/uL (ref 1.4–7.0)
Platelets: 224 10*3/uL (ref 150–450)
RBC: 5.15 x10E6/uL (ref 4.14–5.80)
RDW: 12.1 % — AB (ref 12.3–15.4)
WBC: 6.7 10*3/uL (ref 3.4–10.8)

## 2018-03-28 NOTE — Telephone Encounter (Addendum)
Called spoke to wife who was at the time with Lucas Murphy, Statistician, and Restaurant manager, [employer].  Will fax order sheet for [place] to complete.  Wife wanted to let us know that risperidone 1mg  did help some.  He awoke was fine and as day progressed he became more agitated/ irritated / wanted to push back (when coming back to [facility].  I told them will forward message to MM/NP and then will fax order with her recommendations.  Increase the risperidone?

## 2018-03-28 NOTE — Telephone Encounter (Signed)
Pts wife called stating she feels there has only been a slight change in the pt behaviorstating he hasn't been psychical. Stating she feels the medication change isn't strong enough requesting a call to discuss further.

## 2018-03-28 NOTE — Telephone Encounter (Signed)
I received from [facility] order form that  They need signing.  Will put in inbox.

## 2018-03-28 NOTE — Telephone Encounter (Signed)
This is the first day he took it right? Try to take till Friday. If still no better can call back on Friday

## 2018-03-28 NOTE — Telephone Encounter (Signed)
error 

## 2018-03-29 ENCOUNTER — Telehealth: Payer: Self-pay | Admitting: *Deleted

## 2018-03-29 DIAGNOSIS — F331 Major depressive disorder, recurrent, moderate: Secondary | ICD-10-CM | POA: Diagnosis not present

## 2018-03-29 DIAGNOSIS — F064 Anxiety disorder due to known physiological condition: Secondary | ICD-10-CM | POA: Diagnosis not present

## 2018-03-29 DIAGNOSIS — F0391 Unspecified dementia with behavioral disturbance: Secondary | ICD-10-CM | POA: Diagnosis not present

## 2018-03-29 NOTE — Telephone Encounter (Signed)
-----   Message from Ward Givens, NP sent at 03/28/2018  2:26 PM EST ----- Labs results are unremarkable. Please call patient with results.

## 2018-03-29 NOTE — Telephone Encounter (Signed)
Spoke to wife and relayed that labs were unremarkable per MM/NP.  She verbalized understanding.

## 2018-04-03 ENCOUNTER — Other Ambulatory Visit: Payer: Self-pay | Admitting: Diagnostic Neuroimaging

## 2018-04-10 DIAGNOSIS — F0391 Unspecified dementia with behavioral disturbance: Secondary | ICD-10-CM | POA: Diagnosis not present

## 2018-04-10 DIAGNOSIS — F331 Major depressive disorder, recurrent, moderate: Secondary | ICD-10-CM | POA: Diagnosis not present

## 2018-04-11 DIAGNOSIS — F0390 Unspecified dementia without behavioral disturbance: Secondary | ICD-10-CM | POA: Diagnosis not present

## 2018-04-11 DIAGNOSIS — Z7251 High risk heterosexual behavior: Secondary | ICD-10-CM | POA: Diagnosis not present

## 2018-04-11 DIAGNOSIS — F6089 Other specific personality disorders: Secondary | ICD-10-CM | POA: Diagnosis not present

## 2018-04-19 ENCOUNTER — Encounter (HOSPITAL_COMMUNITY): Payer: Self-pay | Admitting: Internal Medicine

## 2018-04-19 ENCOUNTER — Emergency Department (HOSPITAL_COMMUNITY)
Admission: EM | Admit: 2018-04-19 | Discharge: 2018-04-19 | Disposition: A | Discharge status: Home / Self Care | Payer: Medicare Other | Attending: Emergency Medicine | Admitting: Emergency Medicine

## 2018-04-19 DIAGNOSIS — I10 Essential (primary) hypertension: Secondary | ICD-10-CM | POA: Insufficient documentation

## 2018-04-19 DIAGNOSIS — Z7982 Long term (current) use of aspirin: Secondary | ICD-10-CM | POA: Insufficient documentation

## 2018-04-19 DIAGNOSIS — J45909 Unspecified asthma, uncomplicated: Secondary | ICD-10-CM | POA: Insufficient documentation

## 2018-04-19 DIAGNOSIS — Z87891 Personal history of nicotine dependence: Secondary | ICD-10-CM | POA: Diagnosis not present

## 2018-04-19 DIAGNOSIS — K5900 Constipation, unspecified: Secondary | ICD-10-CM | POA: Diagnosis not present

## 2018-04-19 DIAGNOSIS — Z79899 Other long term (current) drug therapy: Secondary | ICD-10-CM | POA: Diagnosis not present

## 2018-04-19 DIAGNOSIS — J449 Chronic obstructive pulmonary disease, unspecified: Secondary | ICD-10-CM | POA: Diagnosis not present

## 2018-04-19 DIAGNOSIS — R5381 Other malaise: Secondary | ICD-10-CM | POA: Diagnosis not present

## 2018-04-19 DIAGNOSIS — F29 Unspecified psychosis not due to a substance or known physiological condition: Secondary | ICD-10-CM | POA: Diagnosis not present

## 2018-04-19 DIAGNOSIS — K6289 Other specified diseases of anus and rectum: Secondary | ICD-10-CM | POA: Diagnosis present

## 2018-04-19 MED ORDER — POLYETHYLENE GLYCOL 3350 17 G PO PACK: 17.0000 g | PACK | Freq: Every day | ORAL | 0 refills | Status: AC

## 2018-04-19 MED ORDER — ACETAMINOPHEN 325 MG PO TABS
650.0000 mg | ORAL_TABLET | Freq: Once | ORAL | Status: AC | PRN
  Administered 2018-04-19: 650 mg via ORAL
  Filled 2018-04-19: qty 2

## 2018-04-19 NOTE — ED Provider Notes (Signed)
[facility] DEPT Provider Note   CSN: [id number] Arrival date & time: 04/19/18  1124     History   Chief Complaint Chief Complaint  Patient presents with  . Rectal Pain  . Dementia    HPI Lucas Murphy is a 73 y.o. male.  The history is provided by the patient.  GI Problem  This is a new problem. The current episode started 3 to 5 hours ago. The problem occurs constantly. The problem has not changed since onset.Pertinent negatives include no chest pain, no abdominal pain, no headaches and no shortness of breath. Associated symptoms comments: Rectal pain . Nothing aggravates the symptoms. Nothing relieves the symptoms. He has tried nothing for the symptoms. The treatment provided no relief.    Past Medical History:  Diagnosis Date  . Allergic rhinitis, cause unspecified   . Asthma   . Hypertension   . Other and unspecified hyperlipidemia     Patient Active Problem List   Diagnosis Date Noted  . GERD (gastroesophageal reflux disease) 12/29/2015  . 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 Essential hypertension 11/05/2014  . Cough 11/05/2014  . 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 Asthma, intrinsic 05/07/2010  . HYPERLIPIDEMIA 05/06/2010  . ALLERGIC RHINITIS 05/06/2010    Past Surgical History:  Procedure Laterality Date  . APPENDECTOMY    . cataract surgery Left   . COLONOSCOPY    . HEMORROIDECTOMY    . TONSILLECTOMY          Home Medications    Prior to Admission medications   Medication Sig Start Date End Date Taking? Authorizing Provider  aspirin 81 MG tablet Take 81 mg by mouth daily.      [provider]  donepezil (ARICEPT) 10 MG tablet Take 1 tablet (10 mg total) by mouth at bedtime. 05/22/17   Penumalli, Earlean Polka, MD  guaiFENesin (MUCINEX) 600 MG 12 hr tablet Take by mouth 2 (two) times daily.    [provider]  lisinopril (PRINIVIL,ZESTRIL) 10 MG tablet Take 1 tablet by  mouth daily. 10/15/13   [provider]  Multiple Vitamin (MULTIVITAMIN) tablet Take 1 tablet by mouth daily.    [provider]  polyethylene glycol (MIRALAX / GLYCOLAX) packet Take 17 g by mouth daily. 04/19/18 05/19/18  Brad Lieurance, DO  risperiDONE (RISPERDAL) 1 MG tablet Take 1 tablet (1 mg total) by mouth at bedtime. 03/27/18   Ward Givens, NP  sertraline (ZOLOFT) 100 MG tablet Take 1 tablet (100 mg total) by mouth daily. 03/19/18   Kathrynn Ducking, MD    Family History Family History  Problem Relation Age of Onset  . Diabetes Father   . Dementia Father   . Dementia Mother   . Lung disease Neg Hx     Social History Social History   Tobacco Use  . Smoking status: Former Smoker    Packs/day: 0.10    Years: 25.00    Pack years: 2.50    Types: Cigarettes    Last attempt to quit: 04/11/1980    Years since quitting: 38.0  . Smokeless tobacco: Never Used  Substance Use Topics  . Alcohol use: Yes    Alcohol/week: 0.0 standard drinks    Comment: beer 1-2 times a week, occas wine  . Drug use: No     Allergies   Patient has no known allergies.   Review of Systems Review of Systems  Constitutional: Negative for chills and fever.  HENT:  Negative for ear pain and sore throat.   Eyes: Negative for pain and visual disturbance.  Respiratory: Negative for cough and shortness of breath.   Cardiovascular: Negative for chest pain and palpitations.  Gastrointestinal: Positive for constipation and rectal pain. Negative for abdominal distention, abdominal pain, anal bleeding, blood in stool, diarrhea, nausea and vomiting.  Genitourinary: Negative for dysuria and hematuria.  Musculoskeletal: Negative for arthralgias and back pain.  Skin: Negative for color change and rash.  Neurological: Negative for seizures, syncope and headaches.  All other systems reviewed and are negative.    Physical Exam Updated Vital Signs  ED Triage Vitals  Enc Vitals Group     BP  04/19/18 1137 (!) 104/59     Pulse Rate 04/19/18 1137 (!) 53     Resp 04/19/18 1137 16     Temp 04/19/18 1137 97.8 F (36.6 C)     Temp Source 04/19/18 1137 Oral     SpO2 04/19/18 1137 100 %     Weight --      Height --      Head Circumference --      Peak Flow --      Pain Score 04/19/18 1141 10     Pain Loc --      Pain Edu? --      Excl. in [place]? --     Physical Exam Abdominal:     General: Abdomen is flat. There is no distension.     Palpations: Abdomen is soft. There is no mass.     Tenderness: There is no abdominal tenderness. There is no guarding.  Genitourinary:    Comments: No hemorrhoids, stool at rectum Skin:    Capillary Refill: Capillary refill takes less than 2 seconds.  Neurological:     General: No focal deficit present.      ED Treatments / Results  Labs (all labs ordered are listed, but only abnormal results are displayed) Labs Reviewed - No data to display  EKG None  Radiology No results found.  Procedures Procedures (including critical care time)  Medications Ordered in ED Medications  acetaminophen (TYLENOL) tablet 650 mg (650 mg Oral Given 04/19/18 1221)     Initial Impression / Assessment and Plan / ED Course  I have reviewed the triage vital signs and the nursing notes.  Pertinent labs & imaging results that were available during my care of the patient were reviewed by me and considered in my medical decision making (see chart for details).     Lucas Murphy is a 73 year old male with history of dementia who presents to the ED with rectal pain.  Patient with unremarkable vitals.  No fever.  Symptoms for the last several hours.  Patient pleasantly demented but overall is well-appearing.  No abdominal tenderness on exam. No n/v. No concern for obstruction. Patient with stool ball at his rectum but he refuses attempt at manual disimpaction.  Wife states that patient has no history of constipation.  Will use MiraLAX at home.  Suspect that  patient will feel better once he has a bowel movement.  No obvious hemorrhoids, no melena, no hematochezia on exam.  Recommend good bowel regimen, hydration.  Recommend follow-up with primary care doctor and discharged from ED in good condition.  This chart was dictated using voice recognition software.  Despite best efforts to proofread,  errors can occur which can change the documentation meaning.   Final Clinical Impressions(s) / ED Diagnoses   Final diagnoses:  Constipation, unspecified  constipation type    ED Discharge Orders         Ordered    polyethylene glycol (MIRALAX / GLYCOLAX) packet  Daily     04/19/18 1242           Lennice Sites, DO 04/19/18 1248

## 2018-04-19 NOTE — ED Notes (Signed)
Patient restless and standing at nurses station. This RN attempted to redirect patient to bed. Pt prefers standing position due to pain.

## 2018-04-19 NOTE — ED Notes (Signed)
Bed: [facility] Expected date:  Expected time:  Means of arrival:  Comments: EMS pain on bottom, dementia

## 2018-04-19 NOTE — ED Triage Notes (Signed)
Pt BIB [facility] from home c/o rectal pain that started today per wife. Wife is primary caregiver and states she has not noticed any redness in the affected area. Pt has been having normal bowel movements- LBM yesterday. VSS for this RN.

## 2018-04-26 ENCOUNTER — Other Ambulatory Visit: Payer: Self-pay

## 2018-04-26 ENCOUNTER — Emergency Department (HOSPITAL_COMMUNITY): Payer: Medicare Other

## 2018-04-26 ENCOUNTER — Encounter (HOSPITAL_COMMUNITY): Payer: Self-pay

## 2018-04-26 ENCOUNTER — Emergency Department (HOSPITAL_COMMUNITY)
Admission: EM | Admit: 2018-04-26 | Discharge: 2018-05-02 | Disposition: A | Discharge status: Home / Self Care | Payer: Medicare Other | Attending: Emergency Medicine | Admitting: Emergency Medicine

## 2018-04-26 DIAGNOSIS — R001 Bradycardia, unspecified: Secondary | ICD-10-CM | POA: Diagnosis not present

## 2018-04-26 DIAGNOSIS — R404 Transient alteration of awareness: Secondary | ICD-10-CM | POA: Diagnosis not present

## 2018-04-26 DIAGNOSIS — R451 Restlessness and agitation: Secondary | ICD-10-CM | POA: Diagnosis present

## 2018-04-26 DIAGNOSIS — J45909 Unspecified asthma, uncomplicated: Secondary | ICD-10-CM | POA: Diagnosis not present

## 2018-04-26 DIAGNOSIS — Z87891 Personal history of nicotine dependence: Secondary | ICD-10-CM | POA: Diagnosis not present

## 2018-04-26 DIAGNOSIS — F331 Major depressive disorder, recurrent, moderate: Secondary | ICD-10-CM | POA: Diagnosis not present

## 2018-04-26 DIAGNOSIS — R4689 Other symptoms and signs involving appearance and behavior: Secondary | ICD-10-CM

## 2018-04-26 DIAGNOSIS — Z79899 Other long term (current) drug therapy: Secondary | ICD-10-CM | POA: Diagnosis not present

## 2018-04-26 DIAGNOSIS — Z7982 Long term (current) use of aspirin: Secondary | ICD-10-CM | POA: Insufficient documentation

## 2018-04-26 DIAGNOSIS — F0391 Unspecified dementia with behavioral disturbance: Secondary | ICD-10-CM | POA: Diagnosis not present

## 2018-04-26 DIAGNOSIS — F064 Anxiety disorder due to known physiological condition: Secondary | ICD-10-CM | POA: Diagnosis not present

## 2018-04-26 DIAGNOSIS — F0281 Dementia in other diseases classified elsewhere with behavioral disturbance: Secondary | ICD-10-CM | POA: Diagnosis not present

## 2018-04-26 DIAGNOSIS — S0990XA Unspecified injury of head, initial encounter: Secondary | ICD-10-CM | POA: Diagnosis not present

## 2018-04-26 DIAGNOSIS — I10 Essential (primary) hypertension: Secondary | ICD-10-CM | POA: Insufficient documentation

## 2018-04-26 DIAGNOSIS — F918 Other conduct disorders: Secondary | ICD-10-CM | POA: Diagnosis not present

## 2018-04-26 DIAGNOSIS — F03918 Unspecified dementia, unspecified severity, with other behavioral disturbance: Secondary | ICD-10-CM | POA: Diagnosis present

## 2018-04-26 DIAGNOSIS — F29 Unspecified psychosis not due to a substance or known physiological condition: Secondary | ICD-10-CM | POA: Diagnosis not present

## 2018-04-26 DIAGNOSIS — R4182 Altered mental status, unspecified: Secondary | ICD-10-CM | POA: Diagnosis not present

## 2018-04-26 LAB — URINALYSIS, ROUTINE W REFLEX MICROSCOPIC
BILIRUBIN URINE: NEGATIVE
GLUCOSE, UA: NEGATIVE mg/dL
HGB URINE DIPSTICK: NEGATIVE
Ketones, ur: NEGATIVE mg/dL
Leukocytes, UA: NEGATIVE
Nitrite: NEGATIVE
Protein, ur: NEGATIVE mg/dL
Specific Gravity, Urine: 1.014 (ref 1.005–1.030)
pH: 6 (ref 5.0–8.0)

## 2018-04-26 LAB — CBC WITH DIFFERENTIAL/PLATELET
Abs Immature Granulocytes: 0.02 10*3/uL (ref 0.00–0.07)
Basophils Absolute: 0 10*3/uL (ref 0.0–0.1)
Basophils Relative: 1 %
Eosinophils Absolute: 0.3 10*3/uL (ref 0.0–0.5)
Eosinophils Relative: 5 %
HCT: 40.1 % (ref 39.0–52.0)
Hemoglobin: 12.9 g/dL — ABNORMAL LOW (ref 13.0–17.0)
Immature Granulocytes: 0 %
LYMPHS ABS: 1.5 10*3/uL (ref 0.7–4.0)
Lymphocytes Relative: 26 %
MCH: 30.6 pg (ref 26.0–34.0)
MCHC: 32.2 g/dL (ref 30.0–36.0)
MCV: 95.2 fL (ref 80.0–100.0)
MONO ABS: 0.6 10*3/uL (ref 0.1–1.0)
Monocytes Relative: 10 %
Neutro Abs: 3.5 10*3/uL (ref 1.7–7.7)
Neutrophils Relative %: 58 %
Platelets: 209 10*3/uL (ref 150–400)
RBC: 4.21 MIL/uL — ABNORMAL LOW (ref 4.22–5.81)
RDW: 12.4 % (ref 11.5–15.5)
WBC: 5.9 10*3/uL (ref 4.0–10.5)
nRBC: 0 % (ref 0.0–0.2)

## 2018-04-26 LAB — COMPREHENSIVE METABOLIC PANEL
ALT: 23 U/L (ref 0–44)
ANION GAP: 5 (ref 5–15)
AST: 25 U/L (ref 15–41)
Albumin: 3.7 g/dL (ref 3.5–5.0)
Alkaline Phosphatase: 65 U/L (ref 38–126)
BUN: 17 mg/dL (ref 8–23)
CO2: 27 mmol/L (ref 22–32)
Calcium: 9.3 mg/dL (ref 8.9–10.3)
Chloride: 109 mmol/L (ref 98–111)
Creatinine, Ser: 1.04 mg/dL (ref 0.61–1.24)
GFR calc non Af Amer: >60 mL/min (ref >60)
Glucose, Bld: 82 mg/dL (ref 70–99)
Potassium: 3.9 mmol/L (ref 3.5–5.1)
Sodium: 141 mmol/L (ref 135–145)
Total Bilirubin: 1 mg/dL (ref 0.3–1.2)
Total Protein: 6.1 g/dL — ABNORMAL LOW (ref 6.5–8.1)

## 2018-04-26 LAB — ACETAMINOPHEN LEVEL: Acetaminophen (Tylenol), Serum: <10 ug/mL — ABNORMAL LOW (ref 10–30)

## 2018-04-26 LAB — RAPID URINE DRUG SCREEN, HOSP PERFORMED
Amphetamines: NOT DETECTED
BENZODIAZEPINES: POSITIVE — AB
Barbiturates: NOT DETECTED
COCAINE: NOT DETECTED
OPIATES: NOT DETECTED
Tetrahydrocannabinol: NOT DETECTED

## 2018-04-26 LAB — SALICYLATE LEVEL: Salicylate Lvl: <7 mg/dL (ref 2.8–30.0)

## 2018-04-26 LAB — TSH: TSH: 2.424 u[IU]/mL (ref 0.350–4.500)

## 2018-04-26 LAB — ETHANOL: Alcohol, Ethyl (B): <10 mg/dL (ref <10)

## 2018-04-26 MED ORDER — SERTRALINE HCL 50 MG PO TABS
100.0000 mg | ORAL_TABLET | Freq: Every day | ORAL | Status: DC
  Administered 2018-04-26 – 2018-05-02 (×7): 100 mg via ORAL
  Filled 2018-04-26 (×8): qty 2

## 2018-04-26 MED ORDER — RISPERIDONE 1 MG PO TABS
1.0000 mg | ORAL_TABLET | Freq: Two times a day (BID) | ORAL | Status: DC
  Administered 2018-04-26 – 2018-04-30 (×9): 1 mg via ORAL
  Filled 2018-04-26 (×9): qty 1

## 2018-04-26 MED ORDER — DONEPEZIL HCL 5 MG PO TABS
10.0000 mg | ORAL_TABLET | Freq: Every day | ORAL | Status: DC
  Administered 2018-04-26 – 2018-04-30 (×5): 10 mg via ORAL
  Filled 2018-04-26 (×6): qty 2

## 2018-04-26 MED ORDER — ADULT MULTIVITAMIN W/MINERALS CH
1.0000 | ORAL_TABLET | Freq: Every day | ORAL | Status: DC
  Administered 2018-04-27 – 2018-05-02 (×6): 1 via ORAL
  Filled 2018-04-26 (×6): qty 1

## 2018-04-26 MED ORDER — LORAZEPAM 0.5 MG PO TABS: 0.5000 mg | ORAL_TABLET | Freq: Once | ORAL | Status: DC

## 2018-04-26 MED ORDER — LISINOPRIL 10 MG PO TABS
15.0000 mg | ORAL_TABLET | Freq: Every day | ORAL | Status: DC
  Administered 2018-04-26 – 2018-05-02 (×7): 15 mg via ORAL
  Filled 2018-04-26 (×7): qty 2

## 2018-04-26 MED ORDER — ASPIRIN EC 81 MG PO TBEC
81.0000 mg | DELAYED_RELEASE_TABLET | Freq: Every day | ORAL | Status: DC
  Administered 2018-04-27 – 2018-05-02 (×6): 81 mg via ORAL
  Filled 2018-04-26 (×6): qty 1

## 2018-04-26 MED ORDER — ALPRAZOLAM 0.5 MG PO TABS
0.5000 mg | ORAL_TABLET | Freq: Three times a day (TID) | ORAL | Status: DC | PRN
  Administered 2018-04-26 – 2018-04-27 (×2): 0.5 mg via ORAL
  Filled 2018-04-26 (×2): qty 1

## 2018-04-26 MED ORDER — HALOPERIDOL LACTATE 5 MG/ML IJ SOLN
10.0000 mg | Freq: Once | INTRAMUSCULAR | Status: AC
  Administered 2018-04-26: 10 mg via INTRAMUSCULAR
  Filled 2018-04-26: qty 2

## 2018-04-26 MED ORDER — ALPRAZOLAM 0.5 MG PO TABS
0.5000 mg | ORAL_TABLET | Freq: Once | ORAL | Status: AC
  Administered 2018-04-26: 0.5 mg via ORAL
  Filled 2018-04-26: qty 1

## 2018-04-26 MED ORDER — DIPHENHYDRAMINE HCL 50 MG/ML IJ SOLN
50.0000 mg | Freq: Once | INTRAMUSCULAR | Status: AC
  Administered 2018-04-27: 50 mg via INTRAMUSCULAR
  Filled 2018-04-26: qty 1

## 2018-04-26 MED ORDER — POLYETHYLENE GLYCOL 3350 17 G PO PACK
17.0000 g | PACK | Freq: Every day | ORAL | Status: DC
  Administered 2018-04-27 – 2018-05-02 (×5): 17 g via ORAL
  Filled 2018-04-26 (×6): qty 1

## 2018-04-26 MED ORDER — HALOPERIDOL LACTATE 5 MG/ML IJ SOLN
5.0000 mg | Freq: Once | INTRAMUSCULAR | Status: AC
  Administered 2018-04-26: 5 mg via INTRAMUSCULAR
  Filled 2018-04-26: qty 1
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
## 2018-04-26 NOTE — ED Notes (Signed)
Family at bedside. 

## 2018-04-26 NOTE — ED Notes (Signed)
Pt provided [place] sandwich and 2 glasses water. Able to consume all with no issue.
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
## 2018-04-26 NOTE — ED Notes (Signed)
Bed: [facility] Expected date:  Expected time:  Means of arrival:  Comments: Held triage 4

## 2018-04-26 NOTE — Progress Notes (Signed)
I reviewed note and agree with plan.   Penni Bombard, MD
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
## 2018-04-26 NOTE — Progress Notes (Signed)
Consult request has been received. CSW attempting to follow up at present time.  CSW notes TTS consult is in place and pt is not allowing medication to be given at this time.  Alphonse Guild. Noretta Frier, LCSW, LCAS, CSI Clinical Social Worker Ph: [phone]

## 2018-04-26 NOTE — Progress Notes (Signed)
TTS consulted with Darlyne Russian, NP who recommends inpt gero-psych treatment. TTS to seek placement. EDP Dr. Vanita Panda, MD and [place], RN have been advised.  Lind Covert, MSW, LCSW Therapeutic Triage Specialist  [phone]

## 2018-04-26 NOTE — ED Notes (Signed)
Patient's acuity increased due to patient's aggressiveness and need for 1:1 sitter

## 2018-04-26 NOTE — ED Notes (Signed)
Patient will not let us do vital signs. Waiting on medication to start working.
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
## 2018-04-26 NOTE — ED Notes (Signed)
Pt ambulated to restroom and back with no assist. Pt had small BM.
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
## 2018-04-26 NOTE — ED Notes (Signed)
Pt was ambulated twice to restroom, produced zero urine during both occurrences.
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
## 2018-04-26 NOTE — ED Triage Notes (Signed)
Pt BIBA from [facility] memory care unit w/ c/o aggressive behavior towards staff.  EMS reports no aggressive behavior in transport.

## 2018-04-26 NOTE — ED Notes (Signed)
Bed: WA27 Expected date:  Expected time:  Means of arrival:  Comments: Venita Sheffield

## 2018-04-27 DIAGNOSIS — F0391 Unspecified dementia with behavioral disturbance: Secondary | ICD-10-CM

## 2018-04-27 DIAGNOSIS — F03918 Unspecified dementia, unspecified severity, with other behavioral disturbance: Secondary | ICD-10-CM | POA: Diagnosis present

## 2018-04-27 MED ORDER — ZIPRASIDONE MESYLATE 20 MG IM SOLR
20.0000 mg | Freq: Once | INTRAMUSCULAR | Status: AC | PRN
  Administered 2018-05-01: 20 mg via INTRAMUSCULAR
  Filled 2018-04-27: qty 20

## 2018-04-27 MED ORDER — DIPHENHYDRAMINE HCL 50 MG/ML IJ SOLN
25.0000 mg | Freq: Once | INTRAMUSCULAR | Status: AC | PRN
  Administered 2018-05-01: 25 mg via INTRAMUSCULAR
  Filled 2018-04-27: qty 1
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
## 2018-04-27 NOTE — Progress Notes (Addendum)
CSW received a call from Disposition stating that [place] called and stated that due to two places on the [facility] referral where pt is referred to as having "Mild Cognitive Impairment" and that "due to dementia" needs to be added and re-fax it to [facility].  CSW spoke to [place] at [facility] who stated that if pt had "mild cognitive impairment" due to dementia rather than due to IDD for instance, then this would need to be indicated via a separately written progress note by the pt's psychiatrist on 04/28/17.  This Probation officer will be working in [employer] on 1/18 and will request that the psychiatrist indicate this via a progress note if appropriate.  If this is done then the progress note added to the original [facility] referral will need to be re-faxed to [facility] at fax: [phone].  [facility] referral is on the CSW's desk in the [place] office.  CSW will continue to follow for D/C needs.  Alphonse Guild. Neesha Langton, LCSW, LCAS, CSI Clinical Social Worker Ph: [phone]

## 2018-04-27 NOTE — Progress Notes (Signed)
Patient ID: Lucas Murphy, male   DOB: 1946/03/13, 73 y.o.   MRN: [id number]  Pt observed, lying quietly, in no apparent distress.   Ethelene Hal, FNP-C 04/27/2018      1559

## 2018-04-27 NOTE — ED Notes (Signed)
Pt was trying to climb out of bed.  Difficult to redirect.  Restraint order modified.

## 2018-04-27 NOTE — ED Notes (Signed)
Pts wife here feeding patient.  Pt is more calm but refuses to stay in bed if taken out of soft restraints.
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
## 2018-04-28 DIAGNOSIS — F0281 Dementia in other diseases classified elsewhere with behavioral disturbance: Secondary | ICD-10-CM | POA: Diagnosis not present

## 2018-04-28 DIAGNOSIS — F0391 Unspecified dementia with behavioral disturbance: Secondary | ICD-10-CM | POA: Diagnosis not present

## 2018-04-28 NOTE — Consult Note (Signed)
[facility] Psych ED Progress Note  04/28/2018 1:10 PM Lucas Murphy  MRN:  [id number] Subjective:   Lucas Murphy is a 73 y.o. male patient admitted with aggressive behavior and agitation. Objective: Patient seen  today with his wife by her bedside. Patient is a poor historian and history obtained from his wife revealed that he was diagnosed with Dementia since 2015 and has been seeing a Neurologist who prescribed Aricept for him. In addition, he has history of COPD, HTN, GERD and Hyperlipidemia.  Wife reports that patient has history of living  at [facility] memory care from July 2019 but was discharged in December, 201 due to poor impulse control, intrusive, hypersexual and aggressive behavior.Patient remains aggressive , easily agitated and needs frequent re-directions. He will benefit from placement in a psychiatric facility for further stabilization.    Principal Problem: Dementia with behavioral disturbance (HCC) Diagnosis:  Principal Problem:   Dementia with behavioral disturbance ([place])  Total Time spent with patient: 30 minutes  Past Psychiatric History: as above  Past Medical History:  Past Medical History:  Diagnosis Date  . Allergic rhinitis, cause unspecified   . Asthma   . Hypertension   . Other and unspecified hyperlipidemia     Past Surgical History:  Procedure Laterality Date  . APPENDECTOMY    . cataract surgery Left   . COLONOSCOPY    . HEMORROIDECTOMY    . TONSILLECTOMY     Family History:  Family History  Problem Relation Age of Onset  . Diabetes Father   . Dementia Father   . Dementia Mother   . Lung disease Neg Hx    Family Psychiatric  History:  Social History:  Social History   Substance and Sexual Activity  Alcohol Use Yes  . Alcohol/week: 0.0 standard drinks   Comment: beer 1-2 times a week, occas wine     Social History   Substance and Sexual Activity  Drug Use No    Social History   Socioeconomic History  . Marital status: Married     Spouse name: [name]  . Number of children: 2  . Years of education: College  . Highest education level: Not on file  Occupational History  . Occupation: Retired    Fish farm manager: RETIRED  Social Needs  . Financial resource strain: Not on file  . Food insecurity:    Worry: Not on file    Inability: Not on file  . Transportation needs:    Medical: Not on file    Non-medical: Not on file  Tobacco Use  . Smoking status: Former Smoker    Packs/day: 0.10    Years: 25.00    Pack years: 2.50    Types: Cigarettes    Last attempt to quit: 04/11/1980    Years since quitting: 38.0  . Smokeless tobacco: Never Used  Substance and Sexual Activity  . Alcohol use: Yes    Alcohol/week: 0.0 standard drinks    Comment: beer 1-2 times a week, occas wine  . Drug use: No  . Sexual activity: Not on file  Lifestyle  . Physical activity:    Days per week: Not on file    Minutes per session: Not on file  . Stress: Not on file  Relationships  . Social connections:    Talks on phone: Not on file    Gets together: Not on file    Attends religious service: Not on file    Active member of club or organization: Not on file  Attends meetings of clubs or organizations: Not on file    Relationship status: Not on file  Other Topics Concern  . Not on file  Social History Narrative   He is originally from [place]. He has also lived in [place]. Remote travel to [place] for his son's wedding. Patient lives at home with wife. Wife is Engineer, water at [employer]. 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 He enjoys golfing. He has cats currently. No bird or mold exposure. He does use the hot tub at his club rarely, mostly in the Fall.    Sleep: Poor  Appetite:  Fair  Current Medications: Current Facility-Administered Medications  Medication Dose Route Frequency Provider Last Rate Last Dose  . aspirin EC tablet 81 mg  81 mg Oral Daily Recardo Evangelist, PA-C   81 mg at 04/28/18  0913  . diphenhydrAMINE (BENADRYL) injection 25 mg  25 mg Intramuscular Once PRN Ethelene Hal, NP      . donepezil (ARICEPT) tablet 10 mg  10 mg Oral QHS Recardo Evangelist, PA-C   10 mg at 04/27/18 2215  . lisinopril (PRINIVIL,ZESTRIL) tablet 15 mg  15 mg Oral Daily Recardo Evangelist, PA-C   15 mg at 04/28/18 9371  . multivitamin with minerals tablet 1 tablet  1 tablet Oral Daily Recardo Evangelist, PA-C   1 tablet at 04/28/18 6967  . polyethylene glycol (MIRALAX / GLYCOLAX) packet 17 g  17 g Oral Daily Recardo Evangelist, PA-C   17 g at 04/28/18 0915  . risperiDONE (RISPERDAL) tablet 1 mg  1 mg Oral BID Recardo Evangelist, PA-C   1 mg at 04/28/18 0915  . sertraline (ZOLOFT) tablet 100 mg  100 mg Oral Daily Recardo Evangelist, PA-C   100 mg at 04/28/18 0915  . ziprasidone (GEODON) injection 20 mg  20 mg Intramuscular Once PRN Ethelene Hal, NP       Current Outpatient Medications  Medication Sig Dispense Refill  . ALPRAZolam (XANAX) 0.5 MG tablet Take 0.5 mg by mouth 3 (three) times daily as needed for anxiety (agitation).     Marland Kitchen aspirin 81 MG tablet Take 81 mg by mouth daily.      Marland Kitchen donepezil (ARICEPT) 10 MG tablet Take 1 tablet (10 mg total) by mouth at bedtime. 30 tablet 12  . lisinopril (PRINIVIL,ZESTRIL) 10 MG tablet Take 15 mg by mouth daily.     . Multiple Vitamin (MULTIVITAMIN) tablet Take 1 tablet by mouth daily.    . risperiDONE (RISPERDAL) 1 MG tablet Take 1 tablet (1 mg total) by mouth at bedtime. (Patient taking differently: Take 1 mg by mouth 2 (two) times daily. ) 30 tablet 5  . sertraline (ZOLOFT) 100 MG tablet Take 1 tablet (100 mg total) by mouth daily. 30 tablet 1  . polyethylene glycol (MIRALAX / GLYCOLAX) packet Take 17 g by mouth daily. 30 packet 0    Lab Results:  Results for orders placed or performed during the hospital encounter of 04/26/18 (from the past 48 hour(s))  Comprehensive metabolic panel     Status: Abnormal   Collection Time: 04/26/18   2:41 PM  Result Value Ref Range   Sodium 141 135 - 145 mmol/L   Potassium 3.9 3.5 - 5.1 mmol/L   Chloride 109 98 - 111 mmol/L   CO2 27 22 - 32 mmol/L   Glucose, Bld 82 70 - 99 mg/dL   BUN 17 8 -  23 mg/dL   Creatinine, Ser 1.04 0.61 - 1.24 mg/dL   Calcium 9.3 8.9 - 10.3 mg/dL   Total Protein 6.1 (L) 6.5 - 8.1 g/dL   Albumin 3.7 3.5 - 5.0 g/dL   AST 25 15 - 41 U/L   ALT 23 0 - 44 U/L   Alkaline Phosphatase 65 38 - 126 U/L   Total Bilirubin 1.0 0.3 - 1.2 mg/dL   GFR calc non Af Amer >60 >60 mL/min   GFR calc Af Amer >60 >60 mL/min   Anion gap 5 5 - 15    Comment: Performed at [facility], [place] [address] [place], [place] [address]  Ethanol     Status: None   Collection Time: 04/26/18  2:41 PM  Result Value Ref Range   Alcohol, Ethyl (B) <10 <10 mg/dL    Comment: (NOTE) Lowest detectable limit for serum alcohol is 10 mg/dL. For medical purposes only. Performed at [facility], [place] [address], [place], [place] [address]   Urine rapid drug screen (hosp performed)     Status: Abnormal   Collection Time: 04/26/18  2:41 PM  Result Value Ref Range   Opiates NONE DETECTED NONE DETECTED   Cocaine NONE DETECTED NONE DETECTED   Benzodiazepines POSITIVE (A) NONE DETECTED   Amphetamines NONE DETECTED NONE DETECTED   Tetrahydrocannabinol NONE DETECTED NONE DETECTED   Barbiturates NONE DETECTED NONE DETECTED    Comment: (NOTE) DRUG SCREEN FOR MEDICAL PURPOSES ONLY.  IF CONFIRMATION IS NEEDED FOR ANY PURPOSE, NOTIFY LAB WITHIN 5 DAYS. LOWEST DETECTABLE LIMITS FOR URINE DRUG SCREEN Drug Class                     Cutoff (ng/mL) Amphetamine and metabolites    1000 Barbiturate and metabolites    200 Benzodiazepine                 932 Tricyclics and metabolites     300 Opiates and metabolites        300 Cocaine and metabolites        300 THC                            50 Performed at [facility], [place] [address], [place], [place] [address]   CBC with Diff     Status: Abnormal   Collection Time: 04/26/18  2:41 PM  Result Value Ref Range   WBC 5.9 4.0 - 10.5 K/uL   RBC 4.21 (L) 4.22 - 5.81 MIL/uL   Hemoglobin 12.9 (L) 13.0 - 17.0 g/dL   HCT 40.1 39.0 - 52.0 %   MCV 95.2 80.0 - 100.0 fL   MCH 30.6 26.0 - 34.0 pg   MCHC 32.2 30.0 - 36.0 g/dL   RDW 12.4 11.5 - 15.5 %   Platelets 209 150 - 400 K/uL   nRBC 0.0 0.0 - 0.2 %   Neutrophils Relative % 58 %   Neutro Abs 3.5 1.7 - 7.7 K/uL   Lymphocytes Relative 26 %   Lymphs Abs 1.5 0.7 - 4.0 K/uL   Monocytes Relative 10 %   Monocytes Absolute 0.6 0.1 - 1.0 K/uL   Eosinophils Relative 5 %   Eosinophils Absolute 0.3 0.0 - 0.5 K/uL   Basophils Relative 1 %   Basophils Absolute 0.0 0.0 - 0.1 K/uL   Immature Granulocytes 0 %   Abs Immature Granulocytes 0.02 0.00 -  0.07 K/uL    Comment: Performed at [facility], [place] [address] [place], [place] [address]  Acetaminophen level     Status: Abnormal   Collection Time: 04/26/18  2:41 PM  Result Value Ref Range   Acetaminophen (Tylenol), Serum <10 (L) 10 - 30 ug/mL    Comment: (NOTE) Therapeutic concentrations vary significantly. A range of 10-30 ug/mL  may be an effective concentration for many patients. However, some  are best treated at concentrations outside of this range. Acetaminophen concentrations >150 ug/mL at 4 hours after ingestion  and >50 ug/mL at 12 hours after ingestion are often associated with  toxic reactions. Performed at [facility], [place] [address] [place], [place] [address]   Salicylate level     Status: None   Collection Time: 04/26/18  2:41 PM  Result Value Ref Range   Salicylate Lvl <6.2 2.8 - 30.0 mg/dL    Comment: Performed at [facility], [place] [address], [place], [place] [address]  TSH     Status: None   Collection Time: 04/26/18  2:41 PM  Result Value Ref Range   TSH 2.424 0.350 - 4.500 uIU/mL    Comment:  Performed by a 3rd Generation assay with a functional sensitivity of <=0.01 uIU/mL. Performed at [facility], [place] [address], [place], [place] [address]   Urinalysis, Routine w reflex microscopic     Status: None   Collection Time: 04/26/18  2:42 PM  Result Value Ref Range   Color, Urine YELLOW YELLOW   APPearance CLEAR CLEAR   Specific Gravity, Urine 1.014 1.005 - 1.030   pH 6.0 5.0 - 8.0   Glucose, UA NEGATIVE NEGATIVE mg/dL   Hgb urine dipstick NEGATIVE NEGATIVE   Bilirubin Urine NEGATIVE NEGATIVE   Ketones, ur NEGATIVE NEGATIVE mg/dL   Protein, ur NEGATIVE NEGATIVE mg/dL   Nitrite NEGATIVE NEGATIVE   Leukocytes, UA NEGATIVE NEGATIVE    Comment: Performed at [facility], [place] [address], [place], [place] [address]    Blood Alcohol level:  Lab Results  Component Value Date   ETH <10 04/26/2018    Physical Findings: AIMS:  , ,  ,  ,    CIWA:    COWS:     Musculoskeletal: Strength & Muscle Tone: within normal limits Gait & Station: unable to assess Patient leans: N/A  Psychiatric Specialty Exam: Physical Exam  Psychiatric: Thought content normal. His affect is blunt. His speech is delayed. He is agitated and aggressive. Cognition and memory are impaired. He expresses impulsivity.    Review of Systems  Constitutional: Positive for malaise/fatigue.  HENT: Negative.   Eyes: Negative.   Respiratory: Negative.   Cardiovascular: Negative.   Gastrointestinal: Negative.   Genitourinary: Negative.   Musculoskeletal: Negative.   Skin: Negative.   Endo/Heme/Allergies: Negative.   Psychiatric/Behavioral: Positive for memory loss.    Blood pressure (!) 155/82, pulse 78, temperature 98 F (36.7 C), temperature source Oral, resp. rate 20, height 6' (1.829 m), weight 77.1 kg, SpO2 96 %.Body mass index is 23.06 kg/m.  General Appearance: Casual  Eye Contact:  Minimal  Speech:  Slow  Volume:  Decreased  Mood:  Dysphoric  Affect:  Blunt   Thought Process:  Disorganized  Orientation:  Other:  only to person   Thought Content:  Rumination and Tangential  Suicidal Thoughts:  No  Homicidal Thoughts:  No  Memory:  Immediate;   Poor Recent;   Poor Remote;  Poor  Judgement:  Poor  Insight:  Shallow  Psychomotor Activity:  Increased  Concentration:  Concentration: Fair and Attention Span: Fair  Recall:  Poor  Fund of Knowledge:  unable to assess  Language:  Fair  Akathisia:  No  Handed:  Right  AIMS (if indicated):     Assets:  Others:  family support  ADL's:  Impaired  Cognition:  Impaired,  Moderate  Sleep:   fair      Treatment Plan Summary: Daily contact with patient to assess and evaluate symptoms and progress in treatment and Medication management : -Continue Aricept 10 mg qhs for dementia.  -Continue Risperdal 1 mg BID for agitation.  -Continue Zoloft 100 mg daily for mood.   Disposition: Recommend placement in gero psychiatric Inpatient facility for stabilization.  Corena Pilgrim, MD 04/28/2018, 1:10 PM

## 2018-04-28 NOTE — ED Notes (Signed)
Pt resting watch tv. I will continue to monitor.

## 2018-04-28 NOTE — ED Provider Notes (Signed)
Pt is currently calm and comfortable.  Discussed with nursing staff because pt does still become agitated and aggressive.  Restraints for safety renewed.   Dorie Rank, MD 04/28/18 2011

## 2018-04-28 NOTE — Progress Notes (Addendum)
CSW faxed over updated progress note signifying pt is dementia (advanced) and this is the reason for mild cognitive deficits and CSW was told by [name] is on the wait-list at [facility].  Per Georgann Housekeeper, the provider responsible for prioritizing pt's due to violent behavior will not return until Tuesday 05/01/17.  CSW will continue to follow for D/C needs.  Lucas Murphy. Kijana Estock, LCSW, LCAS, CSI Clinical Social Worker Ph: [phone]

## 2018-04-28 NOTE — ED Notes (Signed)
Patient resting in bed. Tolerated medicine whole, miralax mixed in apple juice. Restraints in place. Restraint assessment completed. No skin/circulation issues. Sitter available at all times to assist with toileting. Wife in to visit patient.

## 2018-04-29 DIAGNOSIS — F0391 Unspecified dementia with behavioral disturbance: Secondary | ICD-10-CM | POA: Diagnosis not present

## 2018-04-29 DIAGNOSIS — F0281 Dementia in other diseases classified elsewhere with behavioral disturbance: Secondary | ICD-10-CM | POA: Diagnosis not present

## 2018-04-29 NOTE — ED Notes (Signed)
Lucas Murphy NT and this writer loosened restraints one at a time to clean patient. Pt became agitated and attempted to grab/hit at staff.

## 2018-04-29 NOTE — ED Notes (Signed)
Pt remains in a soft waist restraint but wrist restraints have been loosened. He had a bowel movement this morning. He received medications crushed in apple sauce and required cajoling to take them. His wife arrived after that, and he was agreeable to drinking Miralax in her presence. Will continue to monitor for needs/safety.

## 2018-04-29 NOTE — Consult Note (Signed)
[facility] Psych ED Progress Note  04/29/2018 12:00 PM Lucas Murphy  MRN:  [id number] Subjective:   ''He is still aggressive bur less agitated.'' Objective: Patient was seen, chart reviewed and his case discussed with the treatment team. Patient's wife at his bedside reports that patient has become less agitated and seems to be sleeping better now. Staff nurse reports that patient is still aggressive and has needed soft restraint from time to time for his safety. Patient has become less intrusive, hypersexual, impulsive but still needs re-directions. He will benefit from placement in a psychiatric facility for further stabilization.   Principal Problem: Dementia with behavioral disturbance (HCC) Diagnosis:  Principal Problem:   Dementia with behavioral disturbance ([place])  Total Time spent with patient: 30 minutes  Past Psychiatric History: as above  Past Medical History:  Past Medical History:  Diagnosis Date  . Allergic rhinitis, cause unspecified   . Asthma   . Hypertension   . Other and unspecified hyperlipidemia     Past Surgical History:  Procedure Laterality Date  . APPENDECTOMY    . cataract surgery Left   . COLONOSCOPY    . HEMORROIDECTOMY    . TONSILLECTOMY     Family History:  Family History  Problem Relation Age of Onset  . Diabetes Father   . Dementia Father   . Dementia Mother   . Lung disease Neg Hx    Family Psychiatric  History:  Social History:  Social History   Substance and Sexual Activity  Alcohol Use Yes  . Alcohol/week: 0.0 standard drinks   Comment: beer 1-2 times a week, occas wine     Social History   Substance and Sexual Activity  Drug Use No    Social History   Socioeconomic History  . Marital status: Married    Spouse name: [name]  . Number of children: 2  . Years of education: College  . Highest education level: Not on file  Occupational History  . Occupation: Retired    Fish farm manager: RETIRED  Social Needs  . Financial resource strain:  Not on file  . Food insecurity:    Worry: Not on file    Inability: Not on file  . Transportation needs:    Medical: Not on file    Non-medical: Not on file  Tobacco Use  . Smoking status: Former Smoker    Packs/day: 0.10    Years: 25.00    Pack years: 2.50    Types: Cigarettes    Last attempt to quit: 04/11/1980    Years since quitting: 38.0  . Smokeless tobacco: Never Used  Substance and Sexual Activity  . Alcohol use: Yes    Alcohol/week: 0.0 standard drinks    Comment: beer 1-2 times a week, occas wine  . Drug use: No  . Sexual activity: Not on file  Lifestyle  . Physical activity:    Days per week: Not on file    Minutes per session: Not on file  . Stress: Not on file  Relationships  . Social connections:    Talks on phone: Not on file    Gets together: Not on file    Attends religious service: Not on file    Active member of club or organization: Not on file    Attends meetings of clubs or organizations: Not on file    Relationship status: Not on file  Other Topics Concern  . Not on file  Social History Narrative   He is originally from [place]. He has  also lived in [place]. Remote travel to [place] for his son's wedding. Patient lives at home with wife. Wife is Engineer, water at [employer]. 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 He enjoys golfing. He has cats currently. No bird or mold exposure. He does use the hot tub at his club rarely, mostly in the Fall.    Sleep: Fair  Appetite:  Fair  Current Medications: Current Facility-Administered Medications  Medication Dose Route Frequency Provider Last Rate Last Dose  . aspirin EC tablet 81 mg  81 mg Oral Daily Recardo Evangelist, PA-C   81 mg at 04/29/18 1005  . diphenhydrAMINE (BENADRYL) injection 25 mg  25 mg Intramuscular Once PRN Ethelene Hal, NP      . donepezil (ARICEPT) tablet 10 mg  10 mg Oral QHS Recardo Evangelist, PA-C   10 mg at 04/28/18 2226  . lisinopril  (PRINIVIL,ZESTRIL) tablet 15 mg  15 mg Oral Daily Recardo Evangelist, PA-C   15 mg at 04/29/18 1005  . multivitamin with minerals tablet 1 tablet  1 tablet Oral Daily Recardo Evangelist, PA-C   1 tablet at 04/29/18 1005  . polyethylene glycol (MIRALAX / GLYCOLAX) packet 17 g  17 g Oral Daily Recardo Evangelist, PA-C   17 g at 04/29/18 1005  . risperiDONE (RISPERDAL) tablet 1 mg  1 mg Oral BID Recardo Evangelist, PA-C   1 mg at 04/29/18 1005  . sertraline (ZOLOFT) tablet 100 mg  100 mg Oral Daily Recardo Evangelist, PA-C   100 mg at 04/29/18 1005  . ziprasidone (GEODON) injection 20 mg  20 mg Intramuscular Once PRN Ethelene Hal, NP       Current Outpatient Medications  Medication Sig Dispense Refill  . ALPRAZolam (XANAX) 0.5 MG tablet Take 0.5 mg by mouth 3 (three) times daily as needed for anxiety (agitation).     Marland Kitchen aspirin 81 MG tablet Take 81 mg by mouth daily.      Marland Kitchen donepezil (ARICEPT) 10 MG tablet Take 1 tablet (10 mg total) by mouth at bedtime. 30 tablet 12  . lisinopril (PRINIVIL,ZESTRIL) 10 MG tablet Take 15 mg by mouth daily.     . Multiple Vitamin (MULTIVITAMIN) tablet Take 1 tablet by mouth daily.    . risperiDONE (RISPERDAL) 1 MG tablet Take 1 tablet (1 mg total) by mouth at bedtime. (Patient taking differently: Take 1 mg by mouth 2 (two) times daily. ) 30 tablet 5  . sertraline (ZOLOFT) 100 MG tablet Take 1 tablet (100 mg total) by mouth daily. 30 tablet 1  . polyethylene glycol (MIRALAX / GLYCOLAX) packet Take 17 g by mouth daily. 30 packet 0    Lab Results:  No results found for this or any previous visit (from the past 48 hour(s)).  Blood Alcohol level:  Lab Results  Component Value Date   ETH <10 04/26/2018    Physical Findings: AIMS:  , ,  ,  ,    CIWA:    COWS:     Musculoskeletal: Strength & Muscle Tone: within normal limits Gait & Station: unable to assess Patient leans: N/A  Psychiatric Specialty Exam: Physical Exam  Psychiatric: Thought content  normal. His affect is blunt. His speech is delayed. He is agitated and aggressive. Cognition and memory are impaired. He expresses impulsivity.    Review of Systems  Constitutional: Positive for malaise/fatigue.  HENT: Negative.   Eyes: Negative.   Respiratory: Negative.  Cardiovascular: Negative.   Gastrointestinal: Negative.   Genitourinary: Negative.   Musculoskeletal: Negative.   Skin: Negative.   Endo/Heme/Allergies: Negative.   Psychiatric/Behavioral: Positive for memory loss.    Blood pressure 122/78, pulse 73, temperature 99.8 F (37.7 C), temperature source Axillary, resp. rate 17, height 6' (1.829 m), weight 77.1 kg, SpO2 94 %.Body mass index is 23.06 kg/m.  General Appearance: Casual  Eye Contact:  Minimal  Speech:  Slow  Volume:  Decreased  Mood:  Dysphoric  Affect:  Blunt  Thought Process:  Disorganized  Orientation:  Other:  only to person   Thought Content:  Rumination and Tangential  Suicidal Thoughts:  No  Homicidal Thoughts:  No  Memory:  Immediate;   Poor Recent;   Poor Remote;   Poor  Judgement:  Poor  Insight:  Shallow  Psychomotor Activity:  Increased  Concentration:  Concentration: Fair and Attention Span: Fair  Recall:  Poor  Fund of Knowledge:  unable to assess  Language:  Fair  Akathisia:  No  Handed:  Right  AIMS (if indicated):     Assets:  Others:  family support  ADL's:  Impaired  Cognition:  Impaired,  Moderate  Sleep:   fair      Treatment Plan Summary: Daily contact with patient to assess and evaluate symptoms and progress in treatment and Medication management : -Continue Aricept 10 mg qhs for dementia.  -Continue Risperdal 1 mg BID for agitation.  -Continue Zoloft 100 mg daily for mood.   Disposition: Recommend placement in gero psychiatric Inpatient facility for stabilization.  Corena Pilgrim, MD 04/29/2018, 12:00 PM

## 2018-04-29 NOTE — ED Provider Notes (Signed)
Blood pressure (!) 161/94, pulse 74, temperature 98.3 F (36.8 C), temperature source Axillary, resp. rate 18, height 6' (1.829 m), weight 77.1 kg, SpO2 97 %.  In short, Lucas Murphy is a 73 y.o. male with a chief complaint of Aggressive Behavior .  Refer to the original H&P for additional details.  Patient is calm and comfortable. Occasionally aggressive and verbally agitated at times. Continue restraints for now but assess for the earliest opportunity for removal.     Jenea Dake, Wonda Olds, MD 04/29/18 1932

## 2018-04-29 NOTE — Progress Notes (Signed)
CSW staffed pt with the ED Psychiatric Team who directs that at this time the pt is appropriate for geropsyche inpatient psychiatric placement.   CSW referred pt out to the following facilities:    [facility]      [facility]      [facility]     CSW will continue to follow for D/C needs.  Alphonse Guild. Estelle Skibicki, LCSW, LCAS, CSI Clinical Social Worker Ph: [phone]

## 2018-04-29 NOTE — ED Notes (Signed)
Pt kept trying to get out of bed, despite Posey waist restraint, and began batting his hands at staff. Pt was not responsive to redirection and other means of calming, such as lowering the lights, and continued behavior. Soft wrist restraints reapplied for safety. [name] skin is intact with no signs of injury. Sitter remains in place.

## 2018-04-30 DIAGNOSIS — F0391 Unspecified dementia with behavioral disturbance: Secondary | ICD-10-CM | POA: Diagnosis not present

## 2018-04-30 NOTE — ED Notes (Signed)
Patient resting calmly, wife is at bedside.

## 2018-04-30 NOTE — ED Notes (Signed)
Pt was swatting at tech while feeding, but fairly easy to redirect.

## 2018-04-30 NOTE — BH Assessment (Signed)
[facility] Assessment Progress Note  At 10:44 this writer called [facility] and spoke to [place].  He confirms that pt is currently on their wait list, and that the staff who determine whether or not to prioritize him will not be on hand until tomorrow.  Jalene Mullet, [place] Coordinator [phone]

## 2018-04-30 NOTE — Consult Note (Signed)
[facility] Psych ED Progress Note  04/30/2018 10:38 AM Lucas Murphy  MRN:  [id number] Subjective:   Lucas Murphy appeared calm in bed today although his bedside sitter reports that he was recently agitated while she tried to assist him with using the bathroom. He continues to intermittently require soft restraints. He has been taking his psychotropic medications with apple sauce.   Principal Problem: Dementia with behavioral disturbance ([place]) Diagnosis:  Principal Problem:   Dementia with behavioral disturbance ([place])  Total Time spent with patient: 15 minutes  Past Psychiatric History: Denies   Past Medical History:  Past Medical History:  Diagnosis Date  . Allergic rhinitis, cause unspecified   . Asthma   . Hypertension   . Other and unspecified hyperlipidemia     Past Surgical History:  Procedure Laterality Date  . APPENDECTOMY    . cataract surgery Left   . COLONOSCOPY    . HEMORROIDECTOMY    . TONSILLECTOMY     Family History:  Family History  Problem Relation Age of Onset  . Diabetes Father   . Dementia Father   . Dementia Mother   . Lung disease Neg Hx    Family Psychiatric  History: As listed above.  Social History:  Social History   Substance and Sexual Activity  Alcohol Use Yes  . Alcohol/week: 0.0 standard drinks   Comment: beer 1-2 times a week, occas wine     Social History   Substance and Sexual Activity  Drug Use No    Social History   Socioeconomic History  . Marital status: Married    Spouse name: [name]  . Number of children: 2  . Years of education: College  . Highest education level: Not on file  Occupational History  . Occupation: Retired    Fish farm manager: RETIRED  Social Needs  . Financial resource strain: Not on file  . Food insecurity:    Worry: Not on file    Inability: Not on file  . Transportation needs:    Medical: Not on file    Non-medical: Not on file  Tobacco Use  . Smoking status: Former Smoker    Packs/day: 0.10    Years: 25.00     Pack years: 2.50    Types: Cigarettes    Last attempt to quit: 04/11/1980    Years since quitting: 38.0  . Smokeless tobacco: Never Used  Substance and Sexual Activity  . Alcohol use: Yes    Alcohol/week: 0.0 standard drinks    Comment: beer 1-2 times a week, occas wine  . Drug use: No  . Sexual activity: Not on file  Lifestyle  . Physical activity:    Days per week: Not on file    Minutes per session: Not on file  . Stress: Not on file  Relationships  . Social connections:    Talks on phone: Not on file    Gets together: Not on file    Attends religious service: Not on file    Active member of club or organization: Not on file    Attends meetings of clubs or organizations: Not on file    Relationship status: Not on file  Other Topics Concern  . Not on file  Social History Narrative   He is originally from [place]. He has also lived in [place]. Remote travel to [place] for his son's wedding. Patient lives at home with wife. Wife is Engineer, water at [employer]. 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 He enjoys golfing. He has cats currently. No bird or mold exposure. He does use the hot tub at his club rarely, mostly in the Fall.    Sleep: Fair  Appetite:  Fair  Current Medications: Current Facility-Administered Medications  Medication Dose Route Frequency Provider Last Rate Last Dose  . aspirin EC tablet 81 mg  81 mg Oral Daily Recardo Evangelist, PA-C   81 mg at 04/30/18 1026  . diphenhydrAMINE (BENADRYL) injection 25 mg  25 mg Intramuscular Once PRN Ethelene Hal, NP      . donepezil (ARICEPT) tablet 10 mg  10 mg Oral QHS Recardo Evangelist, PA-C   10 mg at 04/29/18 2116  . lisinopril (PRINIVIL,ZESTRIL) tablet 15 mg  15 mg Oral Daily Recardo Evangelist, PA-C   15 mg at 04/30/18 1027  . multivitamin with minerals tablet 1 tablet  1 tablet Oral Daily Recardo Evangelist, PA-C   1 tablet at 04/30/18 1027  . polyethylene glycol  (MIRALAX / GLYCOLAX) packet 17 g  17 g Oral Daily Recardo Evangelist, PA-C   17 g at 04/30/18 1026  . risperiDONE (RISPERDAL) tablet 1 mg  1 mg Oral BID Recardo Evangelist, PA-C   1 mg at 04/30/18 1026  . sertraline (ZOLOFT) tablet 100 mg  100 mg Oral Daily Recardo Evangelist, PA-C   100 mg at 04/30/18 1026  . ziprasidone (GEODON) injection 20 mg  20 mg Intramuscular Once PRN Ethelene Hal, NP       Current Outpatient Medications  Medication Sig Dispense Refill  . ALPRAZolam (XANAX) 0.5 MG tablet Take 0.5 mg by mouth 3 (three) times daily as needed for anxiety (agitation).     Marland Kitchen aspirin 81 MG tablet Take 81 mg by mouth daily.      Marland Kitchen donepezil (ARICEPT) 10 MG tablet Take 1 tablet (10 mg total) by mouth at bedtime. 30 tablet 12  . lisinopril (PRINIVIL,ZESTRIL) 10 MG tablet Take 15 mg by mouth daily.     . Multiple Vitamin (MULTIVITAMIN) tablet Take 1 tablet by mouth daily.    . risperiDONE (RISPERDAL) 1 MG tablet Take 1 tablet (1 mg total) by mouth at bedtime. (Patient taking differently: Take 1 mg by mouth 2 (two) times daily. ) 30 tablet 5  . sertraline (ZOLOFT) 100 MG tablet Take 1 tablet (100 mg total) by mouth daily. 30 tablet 1  . polyethylene glycol (MIRALAX / GLYCOLAX) packet Take 17 g by mouth daily. 30 packet 0    Lab Results: No results found for this or any previous visit (from the past 48 hour(s)).  Blood Alcohol level:  Lab Results  Component Value Date   ETH <10 04/26/2018    Musculoskeletal: Strength & Muscle Tone: Generalized weakness. Gait & Station: UTA since patient is lying in bed. Patient leans: N/A  Psychiatric Specialty Exam: Physical Exam  Nursing note and vitals reviewed. Constitutional: He is oriented to person, place, and time. He appears well-developed and well-nourished.  HENT:  Head: Normocephalic and atraumatic.  Neck: Normal range of motion.  Respiratory: Effort normal.  Musculoskeletal: Normal range of motion.  Neurological: He is alert  and oriented to person, place, and time.  Psychiatric: He has a normal mood and affect. His speech is normal and behavior is normal. Thought content normal. Cognition and memory are impaired. He expresses impulsivity.    Review of Systems  Unable to perform ROS: Dementia    Blood pressure (!) 119/99, pulse 76,  temperature (!) 97.5 F (36.4 C), temperature source Axillary, resp. rate 16, height 6' (1.829 m), weight 77.1 kg, SpO2 98 %.Body mass index is 23.06 kg/m.  General Appearance: Fairly Groomed, elderly, Caucasian male, wearing paper hospital scrubs who is lying in bed. NAD.   Eye Contact:  Fair  Speech:  Clear and Coherent and Normal Rate  Volume:  Normal  Mood:  Irritable  Affect:  Congruent  Thought Process:  Linear and Descriptions of Associations: Intact  Orientation:  Other:  Oriented to person.  Thought Content:  Logical  Suicidal Thoughts:  No  Homicidal Thoughts:  No  Memory:  Immediate;   Poor Recent;   Fair Remote;   Fair  Judgement:  Impaired  Insight:  Shallow  Psychomotor Activity:  Normal  Concentration:  Concentration: Fair and Attention Span: Fair  Recall:  Poor  Fund of Knowledge:  Fair  Language:  Fair  Akathisia:  No  Handed:  Right  AIMS (if indicated):   N/A  Assets:  Financial Resources/Insurance Housing Intimacy Social Support  ADL's:  Impaired  Cognition:  Impaired. History of dementia.   Sleep:   N/A   Assessment:  Lucas Murphy is a 73 y.o. male who was admitted with agitation in the setting of dementia. His medications are being adjusted for symptom management. He warrants inpatient psychiatric hospitalization for further stabilization and treatment.    Treatment Plan Summary: Daily contact with patient to assess and evaluate symptoms and progress in treatment and Medication management  -Continue Aricept 10 mg qhs for dementia.  -Continue Risperdal 1 mg BID for mood stabilization/hypersexuality.  -Continue Zoloft 100 mg daily for mood.     Faythe Dingwall, DO 04/30/2018, 10:38 AM

## 2018-04-30 NOTE — ED Notes (Signed)
When checking pt to see if pt was wet, after pulling cover back pt started to swing at [place].

## 2018-04-30 NOTE — ED Notes (Signed)
Patient cleaned and changed of incontinence. Pink foam pad applied to sacrum. While turning patient, patient attempted to hit staff. Patient now resting calmly.

## 2018-05-01 DIAGNOSIS — F0391 Unspecified dementia with behavioral disturbance: Secondary | ICD-10-CM | POA: Diagnosis not present

## 2018-05-01 MED ORDER — ASENAPINE MALEATE 5 MG SL SUBL
5.0000 mg | SUBLINGUAL_TABLET | Freq: Two times a day (BID) | SUBLINGUAL | Status: DC
  Administered 2018-05-01 – 2018-05-02 (×3): 5 mg via SUBLINGUAL
  Filled 2018-05-01 (×3): qty 1

## 2018-05-01 MED ORDER — QUETIAPINE FUMARATE 50 MG PO TABS
50.0000 mg | ORAL_TABLET | Freq: Once | ORAL | Status: AC | PRN
  Administered 2018-05-01: 50 mg via ORAL
  Filled 2018-05-01: qty 1

## 2018-05-01 MED ORDER — LORAZEPAM 2 MG/ML IJ SOLN
2.0000 mg | Freq: Once | INTRAMUSCULAR | Status: AC
  Administered 2018-05-01: 2 mg via INTRAMUSCULAR
  Filled 2018-05-01: qty 1

## 2018-05-01 MED ORDER — STERILE WATER FOR INJECTION IJ SOLN
INTRAMUSCULAR | Status: AC
  Administered 2018-05-01: 17:00:00
  Filled 2018-05-01: qty 10

## 2018-05-01 MED ORDER — ASENAPINE MALEATE 5 MG SL SUBL: 5.0000 mg | SUBLINGUAL_TABLET | Freq: Two times a day (BID) | SUBLINGUAL | Status: DC

## 2018-05-01 NOTE — ED Notes (Signed)
Agitated, trying to get out of bed, unable to follow directions due to his dementia. He has not calmed since getting Ativan earlier this pm prior to writer coming on the unit. Order from Dr Francia Greaves to use soft restraint for his safety at this time. Put a waist belt on him for his safety. Sitter present.

## 2018-05-01 NOTE — ED Notes (Addendum)
Pt becoming increasingly agitated, physically aggressive, kicking nursing staff, trying to leave the unit, pt behavior not redirectable. This nurse notified EDP of pt aggression. Security and [employer] present.  Awaiting orders.

## 2018-05-01 NOTE — ED Notes (Signed)
Pt behavior not redirectable, becoming aggressive, posturing staff, swinging at Animal nutritionist. This nurse notified Takia,NP of pt escalating behavior- instructed to give ordered PRN IM medication.

## 2018-05-01 NOTE — ED Notes (Signed)
Pt OOB walking with this nurse and CNA.

## 2018-05-01 NOTE — ED Notes (Signed)
Pt wife at bedside visiting with pt.  

## 2018-05-01 NOTE — ED Notes (Signed)
Pt becoming restless, attempting to wonder around. Pt compliant with medication regimen. Safety sitter present. Will continue to monitor.

## 2018-05-01 NOTE — ED Notes (Signed)
Patient is still sleeping. Out of restraints resting quietly.

## 2018-05-01 NOTE — Progress Notes (Signed)
Tried to release patient from 5 point restraints. Patient immediately trying to get out of bed, swinging at and grabbing staff. Patient will be unable to be released. New order obtained, patient placed back in restraints. Hoyle Barr, RN

## 2018-05-01 NOTE — BH Assessment (Signed)
[facility] Assessment Progress Note  At 08:26 this writer called [facility] and spoke to [facility].  He reports that pt remains on their wait list, and that he is now a priority referral.  He adds, however, that this could still result in a lengthy wait for a bed on their geriatric unit.  Jalene Mullet, [place] Coordinator [phone]

## 2018-05-01 NOTE — ED Notes (Signed)
Pt becoming increasingly agitated. This nurse notified Takia NP of pt behavior. Awaiting orders.

## 2018-05-01 NOTE — Consult Note (Signed)
[facility] Psych ED Progress Note  05/01/2018 11:07 AM Lucas Murphy  MRN:  [id number] Subjective:   Lucas Murphy appears calm in bed today and is out of restraints. He was agitated overnight and required 5 point restraints. He was trying to get out of bed and swinging and grabbing staff. He has been taking his psychotropic medications with apple sauce. His medication will be adjusted today for agitation.   Principal Problem: Dementia with behavioral disturbance ([place]) Diagnosis:  Principal Problem:   Dementia with behavioral disturbance ([place])  Total Time spent with patient: 15 minutes  Past Psychiatric History: Denies   Past Medical History:  Past Medical History:  Diagnosis Date  . Allergic rhinitis, cause unspecified   . Asthma   . Hypertension   . Other and unspecified hyperlipidemia     Past Surgical History:  Procedure Laterality Date  . APPENDECTOMY    . cataract surgery Left   . COLONOSCOPY    . HEMORROIDECTOMY    . TONSILLECTOMY     Family History:  Family History  Problem Relation Age of Onset  . Diabetes Father   . Dementia Father   . Dementia Mother   . Lung disease Neg Hx    Family Psychiatric  History: As listed above.  Social History:  Social History   Substance and Sexual Activity  Alcohol Use Yes  . Alcohol/week: 0.0 standard drinks   Comment: beer 1-2 times a week, occas wine     Social History   Substance and Sexual Activity  Drug Use No    Social History   Socioeconomic History  . Marital status: Married    Spouse name: [name]  . Number of children: 2  . Years of education: College  . Highest education level: Not on file  Occupational History  . Occupation: Retired    Fish farm manager: RETIRED  Social Needs  . Financial resource strain: Not on file  . Food insecurity:    Worry: Not on file    Inability: Not on file  . Transportation needs:    Medical: Not on file    Non-medical: Not on file  Tobacco Use  . Smoking status: Former Smoker     Packs/day: 0.10    Years: 25.00    Pack years: 2.50    Types: Cigarettes    Last attempt to quit: 04/11/1980    Years since quitting: 38.0  . Smokeless tobacco: Never Used  Substance and Sexual Activity  . Alcohol use: Yes    Alcohol/week: 0.0 standard drinks    Comment: beer 1-2 times a week, occas wine  . Drug use: No  . Sexual activity: Not on file  Lifestyle  . Physical activity:    Days per week: Not on file    Minutes per session: Not on file  . Stress: Not on file  Relationships  . Social connections:    Talks on phone: Not on file    Gets together: Not on file    Attends religious service: Not on file    Active member of club or organization: Not on file    Attends meetings of clubs or organizations: Not on file    Relationship status: Not on file  Other Topics Concern  . Not on file  Social History Narrative   He is originally from [place]. He has also lived in [place]. Remote travel to [place] for his son's wedding. Patient lives at home with wife. Wife is Engineer, water at [employer]. 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 He enjoys golfing. He has cats currently. No bird or mold exposure. He does use the hot tub at his club rarely, mostly in the Fall.    Sleep: Fair  Appetite:  Fair  Current Medications: Current Facility-Administered Medications  Medication Dose Route Frequency Provider Last Rate Last Dose  . asenapine (SAPHRIS) sublingual tablet 5 mg  5 mg Sublingual BID Faythe Dingwall, DO      . aspirin EC tablet 81 mg  81 mg Oral Daily Recardo Evangelist, PA-C   81 mg at 04/30/18 1026  . diphenhydrAMINE (BENADRYL) injection 25 mg  25 mg Intramuscular Once PRN Ethelene Hal, NP      . donepezil (ARICEPT) tablet 10 mg  10 mg Oral QHS Recardo Evangelist, PA-C   10 mg at 04/30/18 2159  . lisinopril (PRINIVIL,ZESTRIL) tablet 15 mg  15 mg Oral Daily Recardo Evangelist, PA-C   15 mg at 04/30/18 1027  . multivitamin with  minerals tablet 1 tablet  1 tablet Oral Daily Recardo Evangelist, PA-C   1 tablet at 04/30/18 1027  . polyethylene glycol (MIRALAX / GLYCOLAX) packet 17 g  17 g Oral Daily Recardo Evangelist, PA-C   17 g at 04/30/18 1026  . sertraline (ZOLOFT) tablet 100 mg  100 mg Oral Daily Recardo Evangelist, PA-C   100 mg at 04/30/18 1026  . ziprasidone (GEODON) injection 20 mg  20 mg Intramuscular Once PRN Ethelene Hal, NP       Current Outpatient Medications  Medication Sig Dispense Refill  . ALPRAZolam (XANAX) 0.5 MG tablet Take 0.5 mg by mouth 3 (three) times daily as needed for anxiety (agitation).     Marland Kitchen aspirin 81 MG tablet Take 81 mg by mouth daily.      Marland Kitchen donepezil (ARICEPT) 10 MG tablet Take 1 tablet (10 mg total) by mouth at bedtime. 30 tablet 12  . lisinopril (PRINIVIL,ZESTRIL) 10 MG tablet Take 15 mg by mouth daily.     . Multiple Vitamin (MULTIVITAMIN) tablet Take 1 tablet by mouth daily.    . risperiDONE (RISPERDAL) 1 MG tablet Take 1 tablet (1 mg total) by mouth at bedtime. (Patient taking differently: Take 1 mg by mouth 2 (two) times daily. ) 30 tablet 5  . sertraline (ZOLOFT) 100 MG tablet Take 1 tablet (100 mg total) by mouth daily. 30 tablet 1  . polyethylene glycol (MIRALAX / GLYCOLAX) packet Take 17 g by mouth daily. 30 packet 0    Lab Results: No results found for this or any previous visit (from the past 48 hour(s)).  Blood Alcohol level:  Lab Results  Component Value Date   ETH <10 04/26/2018    Musculoskeletal: Strength & Muscle Tone: Generalized weakness. Gait & Station: UTA since patient is lying in bed. Patient leans: N/A  Psychiatric Specialty Exam: Physical Exam  Nursing note and vitals reviewed. Constitutional: He is oriented to person, place, and time. He appears well-developed and well-nourished.  HENT:  Head: Normocephalic and atraumatic.  Neck: Normal range of motion.  Respiratory: Effort normal.  Musculoskeletal: Normal range of motion.   Neurological: He is alert and oriented to person, place, and time.  Psychiatric: He has a normal mood and affect. His speech is normal and behavior is normal. Thought content normal. Cognition and memory are impaired. He expresses impulsivity.    Review of Systems  Unable to perform ROS: Dementia  Blood pressure (!) 177/76, pulse (!) 50, temperature (!) 97.5 F (36.4 C), temperature source Oral, resp. rate 16, height 6' (1.829 m), weight 77.1 kg, SpO2 99 %.Body mass index is 23.06 kg/m.  General Appearance: Fairly Groomed, elderly, Caucasian male, wearing paper hospital scrubs who is lying in bed. NAD.   Eye Contact:  Fair  Speech:  Clear and Coherent and Normal Rate  Volume:  Normal  Mood:  Irritable  Affect:  Congruent  Thought Process:  Linear and Descriptions of Associations: Intact  Orientation:  Other:  Oriented to person.  Thought Content:  Logical  Suicidal Thoughts:  No  Homicidal Thoughts:  No  Memory:  Immediate;   Poor Recent;   Fair Remote;   Fair  Judgement:  Impaired  Insight:  Shallow  Psychomotor Activity:  Normal  Concentration:  Concentration: Fair and Attention Span: Fair  Recall:  Poor  Fund of Knowledge:  Fair  Language:  Fair  Akathisia:  No  Handed:  Right  AIMS (if indicated):   N/A  Assets:  Financial Resources/Insurance Housing Intimacy Social Support  ADL's:  Impaired  Cognition:  Impaired. History of dementia.   Sleep:   Okay   Assessment:  Lucas Murphy is a 73 y.o. male who was admitted with agitation in the setting of dementia. His medications are being adjusted for symptom management. He warrants inpatient psychiatric hospitalization for further stabilization and treatment.    Treatment Plan Summary: Daily contact with patient to assess and evaluate symptoms and progress in treatment and Medication management  -Continue Aricept 10 mg qhs for dementia.  -Discontinue Risperdal 1 mg BID and start Saphris 5 mg BID for mood  stabilization. -Continue Zoloft 100 mg daily for mood.    Faythe Dingwall, DO 05/01/2018, 11:07 AM

## 2018-05-01 NOTE — ED Notes (Addendum)
Pt walking unit with CNA/sitter

## 2018-05-01 NOTE — ED Notes (Signed)
Sleeping now and released from posey belt due to his calm manner and deeply sleeping.

## 2018-05-02 DIAGNOSIS — F0281 Dementia in other diseases classified elsewhere with behavioral disturbance: Secondary | ICD-10-CM | POA: Diagnosis not present

## 2018-05-02 DIAGNOSIS — Z7982 Long term (current) use of aspirin: Secondary | ICD-10-CM | POA: Diagnosis not present

## 2018-05-02 DIAGNOSIS — Z87891 Personal history of nicotine dependence: Secondary | ICD-10-CM | POA: Diagnosis not present

## 2018-05-02 DIAGNOSIS — J45909 Unspecified asthma, uncomplicated: Secondary | ICD-10-CM | POA: Diagnosis not present

## 2018-05-02 DIAGNOSIS — Z79899 Other long term (current) drug therapy: Secondary | ICD-10-CM | POA: Diagnosis not present

## 2018-05-02 DIAGNOSIS — F0391 Unspecified dementia with behavioral disturbance: Secondary | ICD-10-CM | POA: Diagnosis not present

## 2018-05-02 DIAGNOSIS — K5900 Constipation, unspecified: Secondary | ICD-10-CM | POA: Diagnosis not present

## 2018-05-02 DIAGNOSIS — K219 Gastro-esophageal reflux disease without esophagitis: Secondary | ICD-10-CM | POA: Diagnosis not present

## 2018-05-02 DIAGNOSIS — F802 Mixed receptive-expressive language disorder: Secondary | ICD-10-CM | POA: Diagnosis not present

## 2018-05-02 DIAGNOSIS — E878 Other disorders of electrolyte and fluid balance, not elsewhere classified: Secondary | ICD-10-CM | POA: Diagnosis not present

## 2018-05-02 DIAGNOSIS — E559 Vitamin D deficiency, unspecified: Secondary | ICD-10-CM | POA: Diagnosis not present

## 2018-05-02 DIAGNOSIS — H5340 Unspecified visual field defects: Secondary | ICD-10-CM | POA: Diagnosis not present

## 2018-05-02 DIAGNOSIS — L853 Xerosis cutis: Secondary | ICD-10-CM | POA: Diagnosis not present

## 2018-05-02 DIAGNOSIS — F064 Anxiety disorder due to known physiological condition: Secondary | ICD-10-CM | POA: Diagnosis not present

## 2018-05-02 DIAGNOSIS — I10 Essential (primary) hypertension: Secondary | ICD-10-CM | POA: Diagnosis not present

## 2018-05-02 DIAGNOSIS — F331 Major depressive disorder, recurrent, moderate: Secondary | ICD-10-CM | POA: Diagnosis not present

## 2018-05-02 DIAGNOSIS — G309 Alzheimer's disease, unspecified: Secondary | ICD-10-CM | POA: Diagnosis not present

## 2018-05-02 DIAGNOSIS — M1990 Unspecified osteoarthritis, unspecified site: Secondary | ICD-10-CM | POA: Diagnosis not present

## 2018-05-02 DIAGNOSIS — F0151 Vascular dementia with behavioral disturbance: Secondary | ICD-10-CM | POA: Diagnosis not present

## 2018-05-02 DIAGNOSIS — J449 Chronic obstructive pulmonary disease, unspecified: Secondary | ICD-10-CM | POA: Diagnosis not present

## 2018-05-02 DIAGNOSIS — E785 Hyperlipidemia, unspecified: Secondary | ICD-10-CM | POA: Diagnosis not present

## 2018-05-02 NOTE — ED Notes (Signed)
Report given to William RN.

## 2018-05-02 NOTE — ED Notes (Signed)
Transport called.

## 2018-05-02 NOTE — ED Notes (Signed)
Has remained asleep all shift and has been out of restraints most of shift since he has been asleep and calm.

## 2018-05-02 NOTE — BH Assessment (Signed)
[facility] Assessment Progress Note  At 08:58 this writer called [facility] and spoke to [place].  He reports that pt remains on their wait list as a priority referral.  Jalene Mullet, [place] Coordinator [phone]

## 2018-05-02 NOTE — BH Assessment (Signed)
[facility] Assessment Progress Note  Per Buford Dresser, DO, this pt continues to require psychiatric hospitalization.  Pt remains under IVC initiated by Dr Mariea Clonts on 04/27/2018.  At 10:54 this Probation officer spoke to [place], Therapist, sports, at [facility].  He requests updated vital signs and MAR's which I have faxed to him and he has confirmed receiving.  Pt has been accepted to their facility.  Dr Mariea Clonts concurs with this decision.  [name] nurse, Lisette Grinder, has been notified, and agrees to call [facility] at [phone] to inform them of transportation arrangements.  Pt is to be transported via [facility].  Jalene Mullet, [place] Coordinator [phone]

## 2018-05-02 NOTE — ED Notes (Signed)
Left side of patient face seemed slightly droopy, unsure of cause. Dr. Sedonia Small called to examine patient. Did stroke exam and patient was cleared to go to [facility] to transport.

## 2018-05-02 NOTE — Consult Note (Addendum)
[facility] Psych ED Progress Note  05/02/2018 11:41 AM Lucas Murphy  MRN:  [id number] Subjective:   Lucas Murphy appears calm in bed today and is out of restraints. He was agitated overnight and required 5 point restraints. He is out of restraints this morning and currently is calm. He was trying to get out of bed and swinging and grabbing staff yesterday afternoon.  He has been taking his psychotropic medications with apple sauce. Pt still requires inpatient hospitalization for medication adjustment and stabilization.   Principal Problem: Dementia with behavioral disturbance ([place]) Diagnosis:  Principal Problem:   Dementia with behavioral disturbance ([place])  Total Time spent with patient: 15 minutes  Past Psychiatric History: Denies   Past Medical History:  Past Medical History:  Diagnosis Date  . Allergic rhinitis, cause unspecified   . Asthma   . Hypertension   . Other and unspecified hyperlipidemia     Past Surgical History:  Procedure Laterality Date  . APPENDECTOMY    . cataract surgery Left   . COLONOSCOPY    . HEMORROIDECTOMY    . TONSILLECTOMY     Family History:  Family History  Problem Relation Age of Onset  . Diabetes Father   . Dementia Father   . Dementia Mother   . Lung disease Neg Hx    Family Psychiatric  History: As listed above.  Social History:  Social History   Substance and Sexual Activity  Alcohol Use Yes  . Alcohol/week: 0.0 standard drinks   Comment: beer 1-2 times a week, occas wine     Social History   Substance and Sexual Activity  Drug Use No    Social History   Socioeconomic History  . Marital status: Married    Spouse name: [name]  . Number of children: 2  . Years of education: College  . Highest education level: Not on file  Occupational History  . Occupation: Retired    Fish farm manager: RETIRED  Social Needs  . Financial resource strain: Not on file  . Food insecurity:    Worry: Not on file    Inability: Not on file  . Transportation  needs:    Medical: Not on file    Non-medical: Not on file  Tobacco Use  . Smoking status: Former Smoker    Packs/day: 0.10    Years: 25.00    Pack years: 2.50    Types: Cigarettes    Last attempt to quit: 04/11/1980    Years since quitting: 38.0  . Smokeless tobacco: Never Used  Substance and Sexual Activity  . Alcohol use: Yes    Alcohol/week: 0.0 standard drinks    Comment: beer 1-2 times a week, occas wine  . Drug use: No  . Sexual activity: Not on file  Lifestyle  . Physical activity:    Days per week: Not on file    Minutes per session: Not on file  . Stress: Not on file  Relationships  . Social connections:    Talks on phone: Not on file    Gets together: Not on file    Attends religious service: Not on file    Active member of club or organization: Not on file    Attends meetings of clubs or organizations: Not on file    Relationship status: Not on file  Other Topics Concern  . Not on file  Social History Narrative   He is originally from [place]. He has also lived in [place]. Remote travel to [place] for his  son's wedding. Patient lives at home with wife. Wife is Engineer, water at [employer]. 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 He enjoys golfing. He has cats currently. No bird or mold exposure. He does use the hot tub at his club rarely, mostly in the Fall.    Sleep: Fair  Appetite:  Fair  Current Medications: Current Facility-Administered Medications  Medication Dose Route Frequency Provider Last Rate Last Dose  . asenapine (SAPHRIS) sublingual tablet 5 mg  5 mg Sublingual BID Faythe Dingwall, DO   5 mg at 05/02/18 1107  . aspirin EC tablet 81 mg  81 mg Oral Daily Recardo Evangelist, PA-C   81 mg at 05/02/18 1107  . donepezil (ARICEPT) tablet 10 mg  10 mg Oral QHS Recardo Evangelist, PA-C   10 mg at 04/30/18 2159  . lisinopril (PRINIVIL,ZESTRIL) tablet 15 mg  15 mg Oral Daily Recardo Evangelist, PA-C   15 mg at 05/02/18  1107  . multivitamin with minerals tablet 1 tablet  1 tablet Oral Daily Recardo Evangelist, PA-C   1 tablet at 05/02/18 1108  . polyethylene glycol (MIRALAX / GLYCOLAX) packet 17 g  17 g Oral Daily Recardo Evangelist, PA-C   17 g at 05/02/18 1107  . sertraline (ZOLOFT) tablet 100 mg  100 mg Oral Daily Recardo Evangelist, PA-C   100 mg at 05/02/18 1107   Current Outpatient Medications  Medication Sig Dispense Refill  . ALPRAZolam (XANAX) 0.5 MG tablet Take 0.5 mg by mouth 3 (three) times daily as needed for anxiety (agitation).     Marland Kitchen aspirin 81 MG tablet Take 81 mg by mouth daily.      Marland Kitchen donepezil (ARICEPT) 10 MG tablet Take 1 tablet (10 mg total) by mouth at bedtime. 30 tablet 12  . lisinopril (PRINIVIL,ZESTRIL) 10 MG tablet Take 15 mg by mouth daily.     . Multiple Vitamin (MULTIVITAMIN) tablet Take 1 tablet by mouth daily.    . risperiDONE (RISPERDAL) 1 MG tablet Take 1 tablet (1 mg total) by mouth at bedtime. (Patient taking differently: Take 1 mg by mouth 2 (two) times daily. ) 30 tablet 5  . sertraline (ZOLOFT) 100 MG tablet Take 1 tablet (100 mg total) by mouth daily. 30 tablet 1  . polyethylene glycol (MIRALAX / GLYCOLAX) packet Take 17 g by mouth daily. 30 packet 0    Lab Results: No results found for this or any previous visit (from the past 48 hour(s)).  Blood Alcohol level:  Lab Results  Component Value Date   ETH <10 04/26/2018    Musculoskeletal: Strength & Muscle Tone: Generalized weakness. Gait & Station: UTA since patient is lying in bed. Patient leans: N/A  Psychiatric Specialty Exam: Physical Exam  Nursing note and vitals reviewed. Constitutional: He is oriented to person, place, and time. He appears well-developed and well-nourished.  HENT:  Head: Normocephalic and atraumatic.  Neck: Normal range of motion.  Respiratory: Effort normal.  Musculoskeletal: Normal range of motion.  Neurological: He is alert and oriented to person, place, and time.  Psychiatric:  He has a normal mood and affect. His speech is normal and behavior is normal. Thought content normal. Cognition and memory are impaired. He expresses impulsivity.    Review of Systems  Unable to perform ROS: Dementia    Blood pressure (!) 141/71, pulse (!) 51, temperature 98.5 F (36.9 C), temperature source Axillary, resp. rate 18, height 6' (1.829 m), weight  77.1 kg, SpO2 96 %.Body mass index is 23.06 kg/m.  General Appearance: Fairly Groomed, elderly, Caucasian male, wearing paper hospital scrubs who is lying in bed. NAD.   Eye Contact:  Fair  Speech:  Clear and Coherent and Normal Rate  Volume:  Normal  Mood:  Irritable  Affect:  Congruent  Thought Process:  Linear and Descriptions of Associations: Intact  Orientation:  Other:  Oriented to person.  Thought Content:  Logical  Suicidal Thoughts:  No  Homicidal Thoughts:  No  Memory:  Immediate;   Poor Recent;   Fair Remote;   Fair  Judgement:  Impaired  Insight:  Shallow  Psychomotor Activity:  Normal  Concentration:  Concentration: Fair and Attention Span: Fair  Recall:  Poor  Fund of Knowledge:  Fair  Language:  Fair  Akathisia:  No  Handed:  Right  AIMS (if indicated):   N/A  Assets:  Financial Resources/Insurance Housing Intimacy Social Support  ADL's:  Impaired  Cognition:  Impaired. History of dementia.   Sleep:   Okay   Assessment:  Lucas Murphy is a 73 y.o. male who was admitted with agitation in the setting of dementia. His medications are being adjusted for symptom management. He warrants inpatient psychiatric hospitalization for further stabilization and treatment. Pt has been accepted to [facility].    Treatment Plan Summary: Daily contact with patient to assess and evaluate symptoms and progress in treatment and Medication management  -Continue Aricept 10 mg qhs for dementia.  -Continue Saphris 5 mg BID for mood stabilization. -Continue Zoloft 100 mg daily for mood.    Ethelene Hal, NP 05/02/2018,  11:41 AM   Patient seen face-to-face for psychiatric evaluation, chart reviewed and case discussed with the physician extender and developed treatment plan. Reviewed the information documented and agree with the treatment plan.  Buford Dresser, DO 05/02/18 11:57 AM

## 2018-05-11 DIAGNOSIS — F064 Anxiety disorder due to known physiological condition: Secondary | ICD-10-CM | POA: Diagnosis not present

## 2018-05-11 DIAGNOSIS — F331 Major depressive disorder, recurrent, moderate: Secondary | ICD-10-CM | POA: Diagnosis not present

## 2018-05-29 ENCOUNTER — Ambulatory Visit: Payer: Self-pay | Admitting: Diagnostic Neuroimaging

## 2019-03-28 DIAGNOSIS — R404 Transient alteration of awareness: Secondary | ICD-10-CM | POA: Diagnosis not present

## 2019-03-28 DIAGNOSIS — R2990 Unspecified symptoms and signs involving the nervous system: Secondary | ICD-10-CM | POA: Diagnosis not present

## 2019-03-28 DIAGNOSIS — J449 Chronic obstructive pulmonary disease, unspecified: Secondary | ICD-10-CM | POA: Diagnosis not present

## 2019-03-28 DIAGNOSIS — L853 Xerosis cutis: Secondary | ICD-10-CM | POA: Diagnosis not present

## 2019-03-28 DIAGNOSIS — G839 Paralytic syndrome, unspecified: Secondary | ICD-10-CM | POA: Diagnosis not present

## 2019-03-28 DIAGNOSIS — R001 Bradycardia, unspecified: Secondary | ICD-10-CM | POA: Diagnosis not present

## 2019-03-28 DIAGNOSIS — G309 Alzheimer's disease, unspecified: Secondary | ICD-10-CM | POA: Diagnosis not present

## 2019-03-28 DIAGNOSIS — R402432 Glasgow coma scale score 3-8, at arrival to emergency department: Secondary | ICD-10-CM | POA: Diagnosis not present

## 2019-03-28 DIAGNOSIS — E878 Other disorders of electrolyte and fluid balance, not elsewhere classified: Secondary | ICD-10-CM | POA: Diagnosis not present

## 2019-03-28 DIAGNOSIS — F802 Mixed receptive-expressive language disorder: Secondary | ICD-10-CM | POA: Diagnosis not present

## 2019-03-28 DIAGNOSIS — N3000 Acute cystitis without hematuria: Secondary | ICD-10-CM | POA: Diagnosis not present

## 2019-03-28 DIAGNOSIS — I6523 Occlusion and stenosis of bilateral carotid arteries: Secondary | ICD-10-CM | POA: Diagnosis not present

## 2019-03-28 DIAGNOSIS — F0281 Dementia in other diseases classified elsewhere with behavioral disturbance: Secondary | ICD-10-CM | POA: Diagnosis not present

## 2019-03-28 DIAGNOSIS — E785 Hyperlipidemia, unspecified: Secondary | ICD-10-CM | POA: Diagnosis not present

## 2019-03-28 DIAGNOSIS — E162 Hypoglycemia, unspecified: Secondary | ICD-10-CM | POA: Diagnosis not present

## 2019-03-28 DIAGNOSIS — F0390 Unspecified dementia without behavioral disturbance: Secondary | ICD-10-CM | POA: Diagnosis not present

## 2019-03-28 DIAGNOSIS — K5900 Constipation, unspecified: Secondary | ICD-10-CM | POA: Diagnosis not present

## 2019-03-28 DIAGNOSIS — F0151 Vascular dementia with behavioral disturbance: Secondary | ICD-10-CM | POA: Diagnosis not present

## 2019-03-28 DIAGNOSIS — R4189 Other symptoms and signs involving cognitive functions and awareness: Secondary | ICD-10-CM | POA: Diagnosis not present

## 2019-03-28 DIAGNOSIS — H5340 Unspecified visual field defects: Secondary | ICD-10-CM | POA: Diagnosis not present

## 2019-03-28 DIAGNOSIS — J181 Lobar pneumonia, unspecified organism: Secondary | ICD-10-CM | POA: Diagnosis not present

## 2019-03-28 DIAGNOSIS — E559 Vitamin D deficiency, unspecified: Secondary | ICD-10-CM | POA: Diagnosis not present

## 2019-03-28 DIAGNOSIS — I10 Essential (primary) hypertension: Secondary | ICD-10-CM | POA: Diagnosis not present

## 2019-03-28 DIAGNOSIS — R531 Weakness: Secondary | ICD-10-CM | POA: Diagnosis not present

## 2019-03-28 DIAGNOSIS — R4182 Altered mental status, unspecified: Secondary | ICD-10-CM | POA: Diagnosis not present

## 2019-03-28 DIAGNOSIS — G9340 Encephalopathy, unspecified: Secondary | ICD-10-CM | POA: Diagnosis not present

## 2019-03-28 DIAGNOSIS — K219 Gastro-esophageal reflux disease without esophagitis: Secondary | ICD-10-CM | POA: Diagnosis not present

## 2019-03-28 DIAGNOSIS — R0902 Hypoxemia: Secondary | ICD-10-CM | POA: Diagnosis not present

## 2019-03-28 DIAGNOSIS — M1990 Unspecified osteoarthritis, unspecified site: Secondary | ICD-10-CM | POA: Diagnosis not present

## 2019-03-28 DIAGNOSIS — E161 Other hypoglycemia: Secondary | ICD-10-CM | POA: Diagnosis not present

## 2019-03-28 DIAGNOSIS — N390 Urinary tract infection, site not specified: Secondary | ICD-10-CM | POA: Diagnosis not present

## 2019-07-22 DIAGNOSIS — R4182 Altered mental status, unspecified: Secondary | ICD-10-CM | POA: Diagnosis not present

## 2019-07-22 DIAGNOSIS — G839 Paralytic syndrome, unspecified: Secondary | ICD-10-CM | POA: Diagnosis not present

## 2019-07-22 DIAGNOSIS — I6523 Occlusion and stenosis of bilateral carotid arteries: Secondary | ICD-10-CM | POA: Diagnosis not present

## 2019-07-22 DIAGNOSIS — R001 Bradycardia, unspecified: Secondary | ICD-10-CM | POA: Diagnosis not present

## 2019-07-22 DIAGNOSIS — R4189 Other symptoms and signs involving cognitive functions and awareness: Secondary | ICD-10-CM | POA: Diagnosis not present

## 2019-07-22 DIAGNOSIS — I10 Essential (primary) hypertension: Secondary | ICD-10-CM | POA: Diagnosis not present

## 2019-07-23 DIAGNOSIS — R2990 Unspecified symptoms and signs involving the nervous system: Secondary | ICD-10-CM | POA: Diagnosis not present

## 2019-07-23 DIAGNOSIS — I639 Cerebral infarction, unspecified: Secondary | ICD-10-CM | POA: Diagnosis not present

## 2019-07-23 DIAGNOSIS — J189 Pneumonia, unspecified organism: Secondary | ICD-10-CM | POA: Diagnosis not present

## 2019-07-23 DIAGNOSIS — R4182 Altered mental status, unspecified: Secondary | ICD-10-CM | POA: Diagnosis not present

## 2019-07-23 DIAGNOSIS — N390 Urinary tract infection, site not specified: Secondary | ICD-10-CM | POA: Diagnosis not present

## 2019-07-23 DIAGNOSIS — G9340 Encephalopathy, unspecified: Secondary | ICD-10-CM | POA: Diagnosis not present

## 2019-07-23 DIAGNOSIS — R402432 Glasgow coma scale score 3-8, at arrival to emergency department: Secondary | ICD-10-CM | POA: Diagnosis not present

## 2019-07-23 DIAGNOSIS — F0390 Unspecified dementia without behavioral disturbance: Secondary | ICD-10-CM | POA: Diagnosis not present

## 2019-07-23 DIAGNOSIS — N3000 Acute cystitis without hematuria: Secondary | ICD-10-CM | POA: Diagnosis not present

## 2019-07-23 DIAGNOSIS — I10 Essential (primary) hypertension: Secondary | ICD-10-CM | POA: Diagnosis not present

## 2019-07-24 DIAGNOSIS — J189 Pneumonia, unspecified organism: Secondary | ICD-10-CM | POA: Diagnosis not present

## 2019-07-24 DIAGNOSIS — R402432 Glasgow coma scale score 3-8, at arrival to emergency department: Secondary | ICD-10-CM | POA: Diagnosis not present

## 2019-07-24 DIAGNOSIS — G309 Alzheimer's disease, unspecified: Secondary | ICD-10-CM | POA: Diagnosis not present

## 2019-07-24 DIAGNOSIS — R2990 Unspecified symptoms and signs involving the nervous system: Secondary | ICD-10-CM | POA: Diagnosis not present

## 2019-07-24 DIAGNOSIS — N390 Urinary tract infection, site not specified: Secondary | ICD-10-CM | POA: Diagnosis not present

## 2019-07-24 DIAGNOSIS — F0281 Dementia in other diseases classified elsewhere with behavioral disturbance: Secondary | ICD-10-CM | POA: Diagnosis not present

## 2019-07-24 DIAGNOSIS — G9340 Encephalopathy, unspecified: Secondary | ICD-10-CM | POA: Diagnosis not present

## 2019-07-24 DIAGNOSIS — I10 Essential (primary) hypertension: Secondary | ICD-10-CM | POA: Diagnosis not present

## 2019-07-24 DIAGNOSIS — R4182 Altered mental status, unspecified: Secondary | ICD-10-CM | POA: Diagnosis not present

## 2019-07-24 DIAGNOSIS — F0390 Unspecified dementia without behavioral disturbance: Secondary | ICD-10-CM | POA: Diagnosis not present

## 2019-07-24 DIAGNOSIS — J9601 Acute respiratory failure with hypoxia: Secondary | ICD-10-CM | POA: Diagnosis not present

## 2019-07-24 DIAGNOSIS — N3000 Acute cystitis without hematuria: Secondary | ICD-10-CM | POA: Diagnosis not present

## 2019-08-10 DEATH — deceased

## 2020-01-22 IMAGING — CT CT HEAD W/O CM
3 series · 16 of 47 positions shown, 19 images · non-contrast
Comparison: None.

CLINICAL DATA: Head trauma

EXAM:
CT HEAD WITHOUT CONTRAST
TECHNIQUE: Contiguous axial images were obtained from the base of the skull
through the vertex without intravenous contrast.

[Series 2: head wo · axial · 0.45mm/px · z∈[+1499,+1639]mm · 10 of 34 slices shown, 13 images]
[im 3/34  brain]
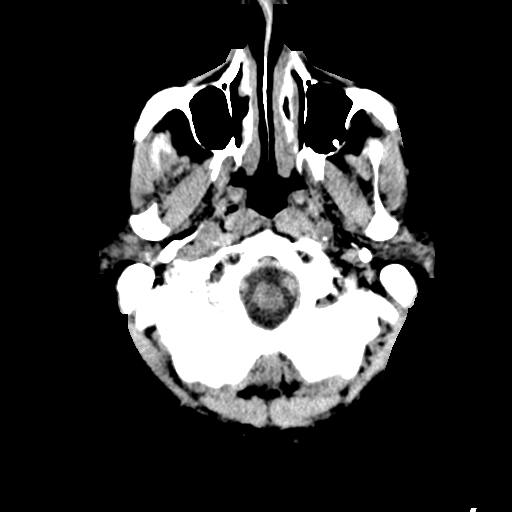
[im 3/34  bone]
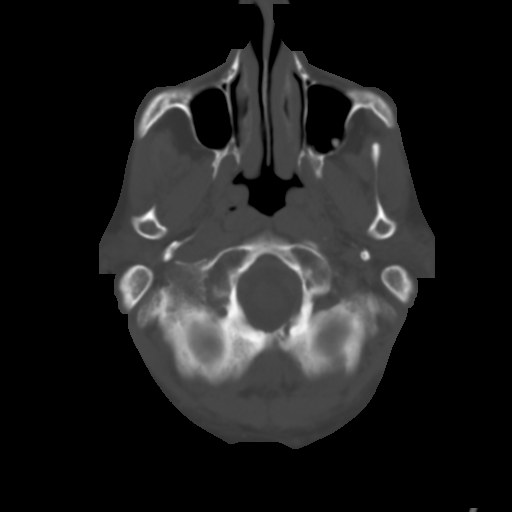
[im 6/34  brain]
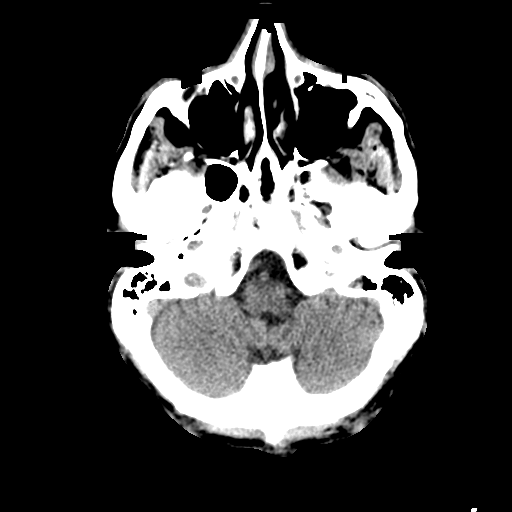
[im 10/34  brain]
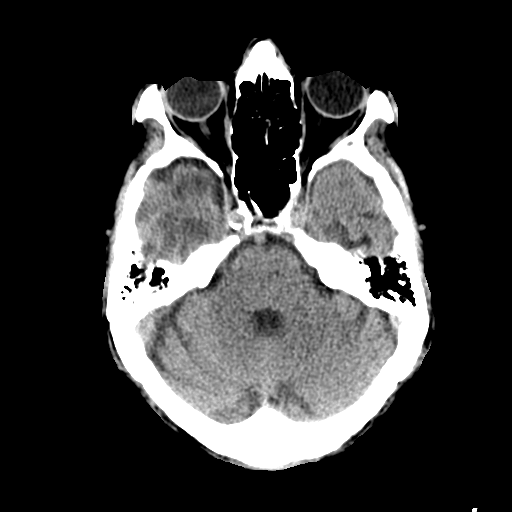
[im 12/34  brain]
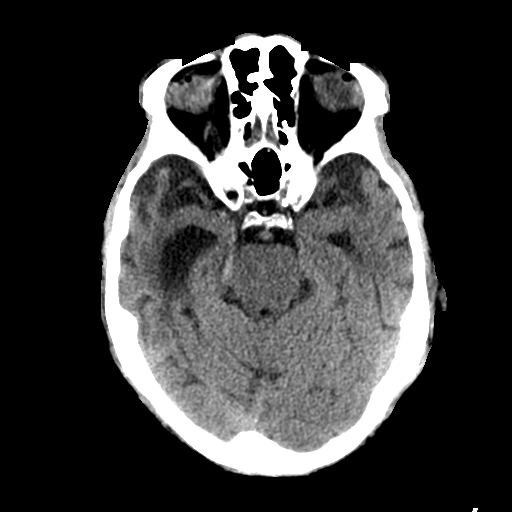
[im 15/34  brain]
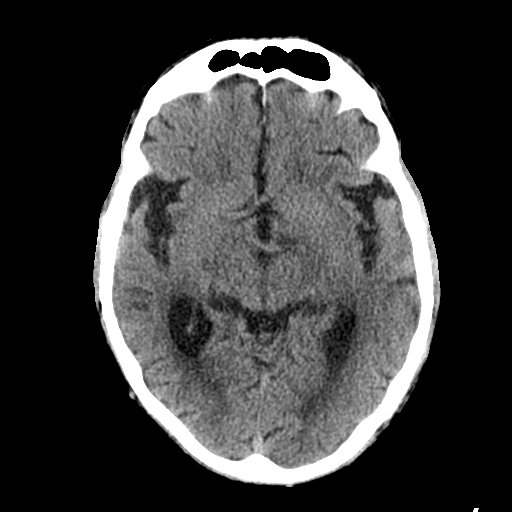
[im 15/34  bone]
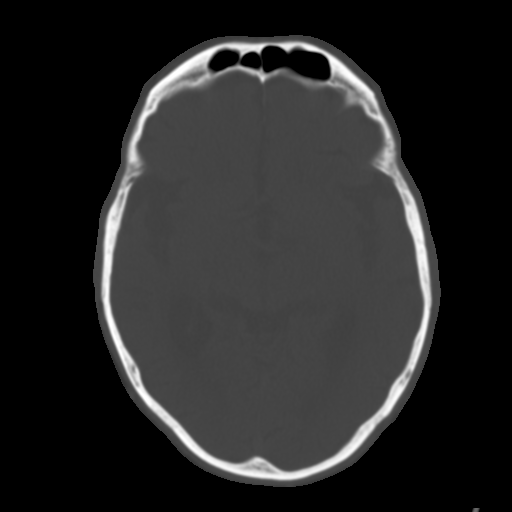
[im 19/34  brain]
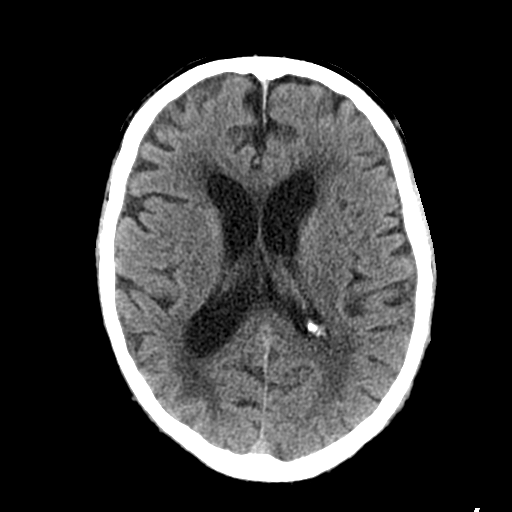
[im 22/34  brain]
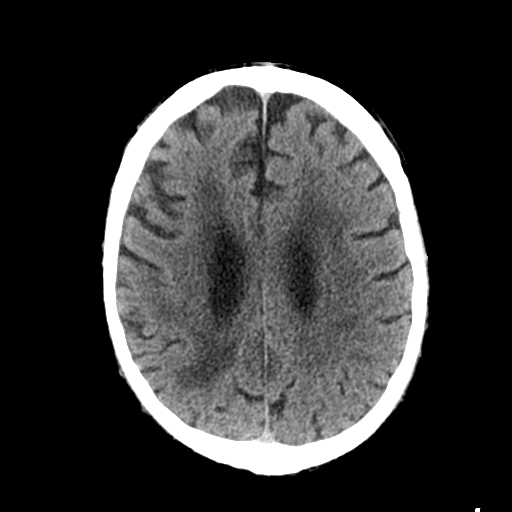
[im 26/34  brain]
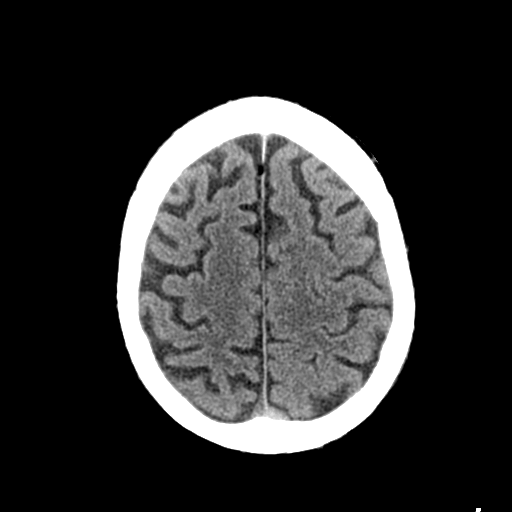
[im 28/34  brain]
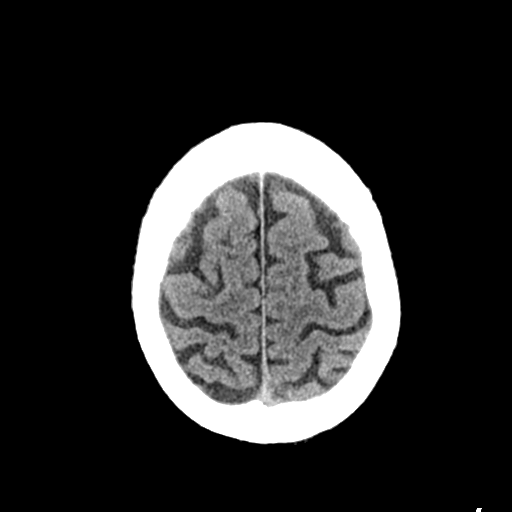
[im 28/34  bone]
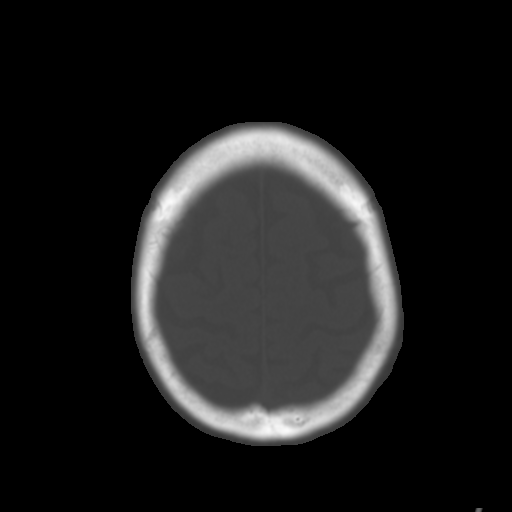
[im 31/34  brain]
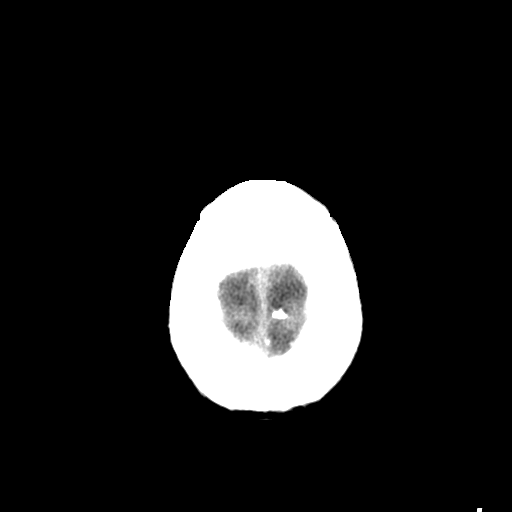

[Series 5: coronal soft tissue · coronal · 0.31mm/px · 3 of 70 slices shown]
[im 24/70  brain]
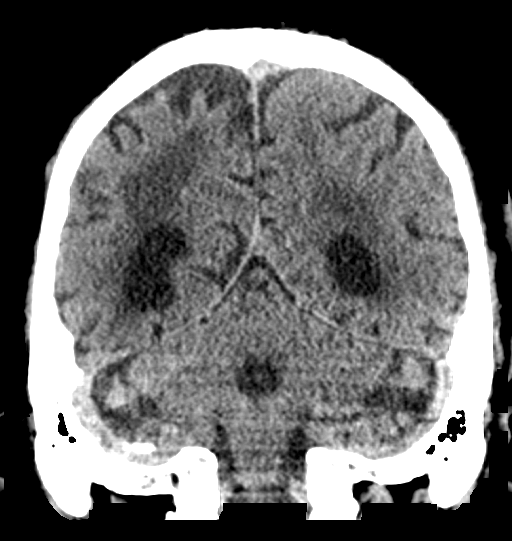
[im 31/70  brain]
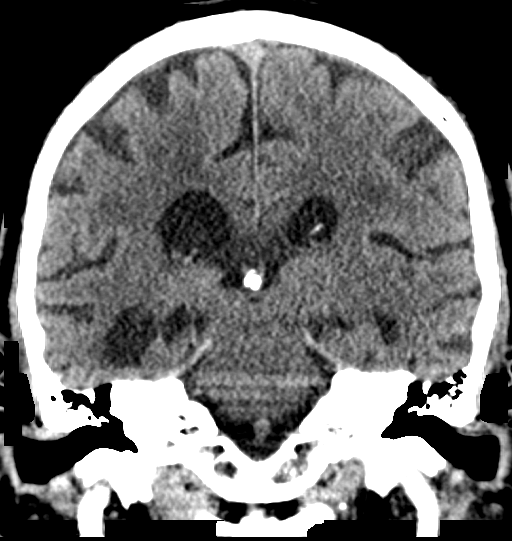
[im 39/70  brain]
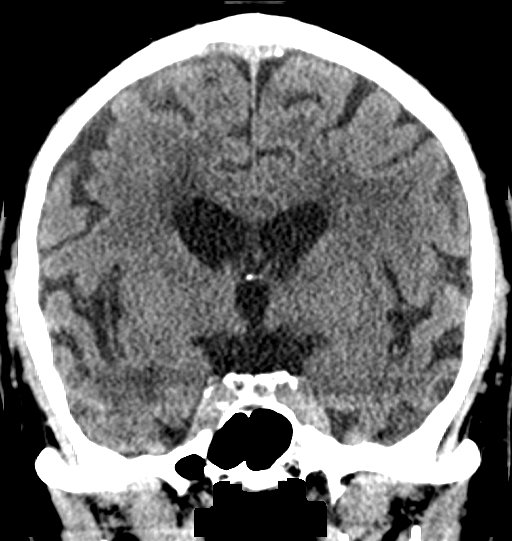

[Series 6: sagittal soft tissue · sagittal · 0.33mm/px · 3 of 53 slices shown]
[im 18/53  brain]
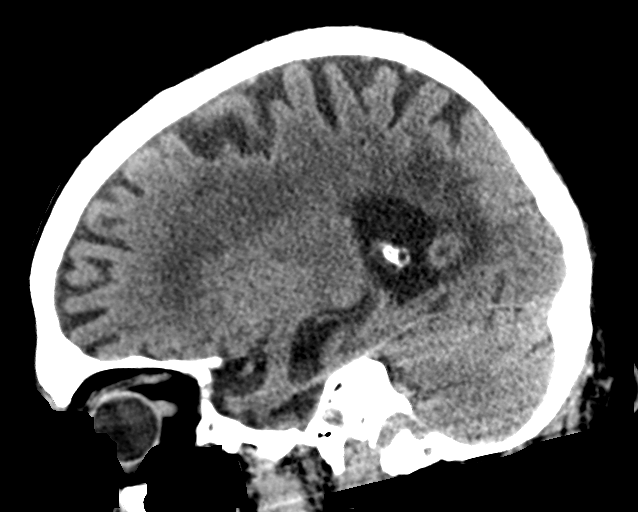
[im 27/53  brain]
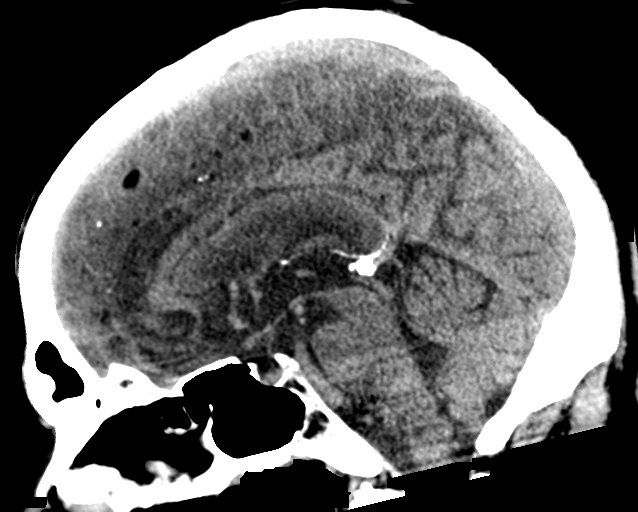
[im 35/53  brain]
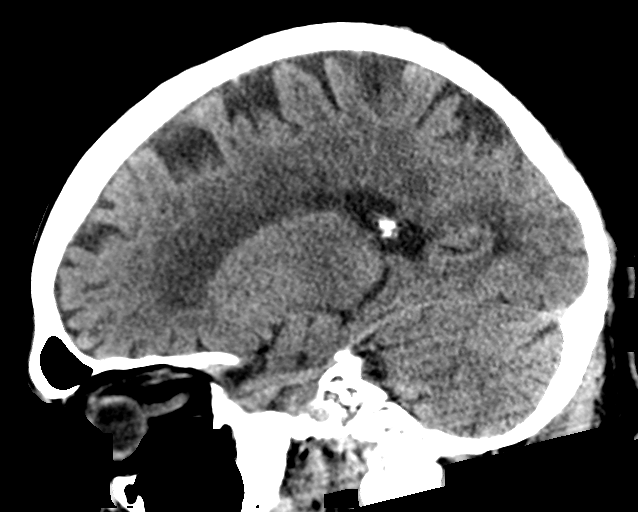

[16 of 47 positions shown; findings below may reference images not displayed]

FINDINGS: Brain: No evidence of acute infarction, hemorrhage, hydrocephalus,
extra-axial collection or mass lesion/mass effect.

Mild cortical atrophy.

Subcortical white matter and periventricular small vessel ischemic
changes.

Mild ex vacuo dilatation of the temporal horn of the right lateral
ventricle, chronic.

Vascular: Mild intracranial atherosclerosis.

Skull: Normal. Negative for fracture or focal lesion.

Sinuses/Orbits: The visualized paranasal sinuses are essentially
clear. The mastoid air cells are unopacified.

Other: None.
IMPRESSION: No evidence of acute intracranial abnormality.

Atrophy with small vessel ischemic changes.
